# Patient Record
Sex: Male | Born: 1985 | Race: Black or African American | Hispanic: No | Marital: Single | Smoking: Never smoker
Health system: Southern US, Community
[De-identification: ages and names within clinical notes are randomized; demographics above are authoritative.]

## PROBLEM LIST (undated history)

## (undated) DIAGNOSIS — I10 Essential (primary) hypertension: Secondary | ICD-10-CM

## (undated) DIAGNOSIS — E119 Type 2 diabetes mellitus without complications: Secondary | ICD-10-CM

---

## 2009-06-05 ENCOUNTER — Emergency Department (HOSPITAL_COMMUNITY): Admission: EM | Admit: 2009-06-05 | Discharge: 2009-06-05 | Payer: Self-pay | Admitting: Emergency Medicine

## 2010-05-10 ENCOUNTER — Ambulatory Visit: Payer: Self-pay | Admitting: Internal Medicine

## 2010-05-10 ENCOUNTER — Telehealth: Payer: Self-pay | Admitting: *Deleted

## 2010-05-10 DIAGNOSIS — Z0289 Encounter for other administrative examinations: Secondary | ICD-10-CM

## 2010-05-10 NOTE — Telephone Encounter (Signed)
Office Message from Date: 05/09/2010 12:00:00 AM Time of Call: [phone] Faxed To: [facility] CallerStephane Niemann Fax Number: [phone] Facility: N/A Patient: Matthew Wiggins, Matthew Wiggins DOB: February 26, 1985 Phone: [phone] Provider: Thomos Lemons Message: See info below Regarding Appointment: Yes Appt Date: 05/10/2010 Appt Time: 9:30:00 AM Provider: Thomos Lemons Reason: Details: cannot keep appointment Outcome: Instructed patient to call back on the next business day. Message Taken by: Caryl Never, CSR FAX Call-A

## 2010-08-07 ENCOUNTER — Ambulatory Visit: Payer: Self-pay | Admitting: Family

## 2010-08-08 ENCOUNTER — Ambulatory Visit: Payer: Self-pay | Admitting: Family

## 2010-08-14 ENCOUNTER — Ambulatory Visit: Payer: Self-pay | Admitting: Family

## 2010-08-14 DIAGNOSIS — Z0289 Encounter for other administrative examinations: Secondary | ICD-10-CM

## 2012-08-16 ENCOUNTER — Emergency Department (INDEPENDENT_AMBULATORY_CARE_PROVIDER_SITE_OTHER): Payer: Managed Care, Other (non HMO)

## 2012-08-16 ENCOUNTER — Emergency Department (INDEPENDENT_AMBULATORY_CARE_PROVIDER_SITE_OTHER)
Admission: EM | Admit: 2012-08-16 | Discharge: 2012-08-16 | Disposition: A | Discharge status: Home / Self Care | Payer: Managed Care, Other (non HMO) | Source: Home / Self Care | Attending: Emergency Medicine | Admitting: Emergency Medicine

## 2012-08-16 ENCOUNTER — Encounter (HOSPITAL_COMMUNITY): Payer: Self-pay | Admitting: Emergency Medicine

## 2012-08-16 DIAGNOSIS — S42001A Fracture of unspecified part of right clavicle, initial encounter for closed fracture: Secondary | ICD-10-CM

## 2012-08-16 DIAGNOSIS — S42009A Fracture of unspecified part of unspecified clavicle, initial encounter for closed fracture: Secondary | ICD-10-CM

## 2012-08-16 HISTORY — DX: Type 2 diabetes mellitus without complications: E11.9

## 2012-08-16 HISTORY — DX: Essential (primary) hypertension: I10

## 2012-08-16 MED ORDER — HYDROCODONE-ACETAMINOPHEN 5-325 MG PO TABS
2.0000 | ORAL_TABLET | Freq: Once | ORAL | Status: AC
  Administered 2012-08-16: 2 via ORAL

## 2012-08-16 MED ORDER — HYDROCODONE-ACETAMINOPHEN 5-325 MG PO TABS
ORAL_TABLET | ORAL | Status: AC
  Filled 2012-08-16: qty 2

## 2012-08-16 MED ORDER — HYDROCODONE-ACETAMINOPHEN 5-325 MG PO TABS: 2.0000 | ORAL_TABLET | ORAL | Status: DC | PRN
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
## 2017-07-12 ENCOUNTER — Emergency Department (HOSPITAL_COMMUNITY)
Admission: EM | Admit: 2017-07-12 | Discharge: 2017-07-12 | Disposition: A | Discharge status: Home / Self Care | Payer: No Typology Code available for payment source | Attending: Emergency Medicine | Admitting: Emergency Medicine

## 2017-07-12 ENCOUNTER — Encounter (HOSPITAL_COMMUNITY): Payer: Self-pay | Admitting: *Deleted

## 2017-07-12 ENCOUNTER — Emergency Department (HOSPITAL_COMMUNITY): Payer: No Typology Code available for payment source

## 2017-07-12 DIAGNOSIS — M7918 Myalgia, other site: Secondary | ICD-10-CM

## 2017-07-12 DIAGNOSIS — M25561 Pain in right knee: Secondary | ICD-10-CM | POA: Diagnosis not present

## 2017-07-12 DIAGNOSIS — Y939 Activity, unspecified: Secondary | ICD-10-CM | POA: Diagnosis not present

## 2017-07-12 DIAGNOSIS — E119 Type 2 diabetes mellitus without complications: Secondary | ICD-10-CM | POA: Diagnosis not present

## 2017-07-12 DIAGNOSIS — S60222A Contusion of left hand, initial encounter: Secondary | ICD-10-CM | POA: Diagnosis not present

## 2017-07-12 DIAGNOSIS — I10 Essential (primary) hypertension: Secondary | ICD-10-CM | POA: Insufficient documentation

## 2017-07-12 DIAGNOSIS — Y999 Unspecified external cause status: Secondary | ICD-10-CM | POA: Insufficient documentation

## 2017-07-12 DIAGNOSIS — Y929 Unspecified place or not applicable: Secondary | ICD-10-CM | POA: Diagnosis not present

## 2017-07-12 DIAGNOSIS — S6982XA Other specified injuries of left wrist, hand and finger(s), initial encounter: Secondary | ICD-10-CM | POA: Diagnosis present

## 2017-07-12 MED ORDER — HYDROCODONE-ACETAMINOPHEN 5-325 MG PO TABS: 2.0000 | ORAL_TABLET | ORAL | 0 refills | Status: DC | PRN

## 2017-07-12 MED ORDER — IBUPROFEN 800 MG PO TABS: 800.0000 mg | ORAL_TABLET | Freq: Three times a day (TID) | ORAL | 0 refills | Status: DC

## 2017-07-12 MED ORDER — IBUPROFEN 800 MG PO TABS
800.0000 mg | ORAL_TABLET | Freq: Once | ORAL | Status: AC
  Administered 2017-07-12: 800 mg via ORAL
  Filled 2017-07-12: qty 1
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
## 2017-07-12 NOTE — Discharge Instructions (Signed)
See the Orthopaedist if pain persist.
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
## 2017-07-12 NOTE — ED Notes (Signed)
Patient ambulatory to nurses station without difficulty. Ace wrap applied to left hand, per PA request.

## 2017-07-12 NOTE — ED Notes (Signed)
Patient transported to X-ray 
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
## 2017-08-22 ENCOUNTER — Encounter: Payer: Self-pay | Admitting: Internal Medicine

## 2017-08-22 ENCOUNTER — Ambulatory Visit: Payer: Self-pay | Attending: Internal Medicine | Admitting: Internal Medicine

## 2017-08-22 VITALS — BP 171/119 | HR 104 | Temp 97.6°F | Resp 16 | Ht 72.0 in | Wt >= 6400 oz

## 2017-08-22 DIAGNOSIS — R7303 Prediabetes: Secondary | ICD-10-CM | POA: Insufficient documentation

## 2017-08-22 DIAGNOSIS — R0683 Snoring: Secondary | ICD-10-CM

## 2017-08-22 DIAGNOSIS — M1731 Unilateral post-traumatic osteoarthritis, right knee: Secondary | ICD-10-CM

## 2017-08-22 DIAGNOSIS — Z8249 Family history of ischemic heart disease and other diseases of the circulatory system: Secondary | ICD-10-CM | POA: Insufficient documentation

## 2017-08-22 DIAGNOSIS — E1159 Type 2 diabetes mellitus with other circulatory complications: Secondary | ICD-10-CM | POA: Insufficient documentation

## 2017-08-22 DIAGNOSIS — Z833 Family history of diabetes mellitus: Secondary | ICD-10-CM | POA: Insufficient documentation

## 2017-08-22 DIAGNOSIS — Z880 Allergy status to penicillin: Secondary | ICD-10-CM | POA: Insufficient documentation

## 2017-08-22 DIAGNOSIS — Z131 Encounter for screening for diabetes mellitus: Secondary | ICD-10-CM | POA: Insufficient documentation

## 2017-08-22 DIAGNOSIS — D649 Anemia, unspecified: Secondary | ICD-10-CM

## 2017-08-22 DIAGNOSIS — Z560 Unemployment, unspecified: Secondary | ICD-10-CM | POA: Insufficient documentation

## 2017-08-22 DIAGNOSIS — I10 Essential (primary) hypertension: Secondary | ICD-10-CM

## 2017-08-22 DIAGNOSIS — Z7984 Long term (current) use of oral hypoglycemic drugs: Secondary | ICD-10-CM | POA: Insufficient documentation

## 2017-08-22 DIAGNOSIS — Z23 Encounter for immunization: Secondary | ICD-10-CM

## 2017-08-22 LAB — POCT GLYCOSYLATED HEMOGLOBIN (HGB A1C): HBA1C, POC (PREDIABETIC RANGE): 6.2 % (ref 5.7–6.4)

## 2017-08-22 LAB — GLUCOSE, POCT (MANUAL RESULT ENTRY): POC Glucose: 149 mg/dl — AB (ref 70–99)

## 2017-08-22 MED ORDER — LISINOPRIL-HYDROCHLOROTHIAZIDE 10-12.5 MG PO TABS: 1.0000 | ORAL_TABLET | Freq: Every day | ORAL | 3 refills | Status: DC

## 2017-08-22 MED ORDER — TETANUS-DIPHTH-ACELL PERTUSSIS 5-2.5-18.5 LF-MCG/0.5 IM SUSP: 0.5000 mL | Freq: Once | INTRAMUSCULAR | 0 refills | Status: AC

## 2017-08-22 MED ORDER — METFORMIN HCL 500 MG PO TABS: 500.0000 mg | ORAL_TABLET | Freq: Every day | ORAL | 6 refills | Status: DC

## 2017-08-22 MED FILL — metFORMIN HCL 500 MG TABS: 500 | 30 days supply | Qty: 30 | Fill #0

## 2017-08-22 MED FILL — LISINOPRIL-HCTZ 10-12.5 MG: 10-12.5 | 30 days supply | Qty: 30 | Fill #0

## 2017-08-22 NOTE — Patient Instructions (Addendum)
Prediabetes Eating Plan Prediabetes-also called impaired glucose tolerance or impaired fasting glucose-is a condition that causes blood sugar (blood glucose) levels to be higher than normal. Following a healthy diet can help to keep prediabetes under control. It can also help to lower the risk of type 2 diabetes and heart disease, which are increased in people who have prediabetes. Along with regular exercise, a healthy diet:  Promotes weight loss.  Helps to control blood sugar levels.  Helps to improve the way that the body uses insulin.  What do I need to know about this eating plan?  Use the glycemic index (GI) to plan your meals. The index tells you how quickly a food will raise your blood sugar. Choose low-GI foods. These foods take a longer time to raise blood sugar.  Pay close attention to the amount of carbohydrates in the food that you eat. Carbohydrates increase blood sugar levels.  Keep track of how many calories you take in. Eating the right amount of calories will help you to achieve a healthy weight. Losing about 7 percent of your starting weight can help to prevent type 2 diabetes.  You may want to follow a Mediterranean diet. This diet includes a lot of vegetables, lean meats or fish, whole grains, fruits, and healthy oils and fats. What foods can I eat? Grains Whole grains, such as whole-wheat or whole-grain breads, crackers, cereals, and pasta. Unsweetened oatmeal. Bulgur. Barley. Quinoa. Brown rice. Corn or whole-wheat flour tortillas or taco shells. Vegetables Lettuce. Spinach. Peas. Beets. Cauliflower. Cabbage. Broccoli. Carrots. Tomatoes. Squash. Eggplant. Herbs. Peppers. Onions. Cucumbers. Brussels sprouts. Fruits Berries. Bananas. Apples. Oranges. Grapes. Papaya. Mango. Pomegranate. Kiwi. Grapefruit. Cherries. Meats and Other Protein Sources Seafood. Lean meats, such as chicken and [place] or lean cuts of pork and beef. Tofu. Eggs. Nuts. Beans. Dairy Low-fat or  fat-free dairy products, such as yogurt, cottage cheese, and cheese. Beverages Water. Tea. Coffee. Sugar-free or diet soda. Seltzer water. Milk. Milk alternatives, such as soy or almond milk. Condiments Mustard. Relish. Low-fat, low-sugar ketchup. Low-fat, low-sugar barbecue sauce. Low-fat or fat-free mayonnaise. Sweets and Desserts Sugar-free or low-fat pudding. Sugar-free or low-fat ice cream and other frozen treats. Fats and Oils Avocado. Walnuts. Olive oil. The items listed above may not be a complete list of recommended foods or beverages. Contact your dietitian for more options. What foods are not recommended? Grains Refined white flour and flour products, such as bread, pasta, snack foods, and cereals. Beverages Sweetened drinks, such as sweet iced tea and soda. Sweets and Desserts Baked goods, such as cake, cupcakes, pastries, cookies, and cheesecake. The items listed above may not be a complete list of foods and beverages to avoid. Contact your dietitian for more information. This information is not intended to replace advice given to you by your health care provider. Make sure you discuss any questions you have with your health care provider. Document Released: 05/04/2014 Document Revised: 05/26/2015 Document Reviewed: 01/13/2014 Elsevier Interactive Patient Education  2017 Elsevier Inc.   Td Vaccine (Tetanus and Diphtheria): What You Need to Know 1. Why get vaccinated? Tetanus  and diphtheria are very serious diseases. They are rare in the [place] today, but people who do become infected often have severe complications. Td vaccine is used to protect adolescents and adults from both of these diseases. Both tetanus and diphtheria are infections caused by bacteria. Diphtheria spreads from person to person through coughing or sneezing. Tetanus-causing bacteria enter the body through cuts, scratches, or wounds. TETANUS (lockjaw) causes  painful muscle tightening and stiffness,  usually all over the body.  It can lead to tightening of muscles in the head and neck so you can't open your mouth, swallow, or sometimes even breathe. Tetanus kills about 1 out of every 10 people who are infected even after receiving the best medical care.  DIPHTHERIA can cause a thick coating to form in the back of the throat.  It can lead to breathing problems, paralysis, heart failure, and death.  Before vaccines, as many as 200,000 cases of diphtheria and hundreds of cases of tetanus were reported in the [place] each year. Since vaccination began, reports of cases for both diseases have dropped by about 99%. 2. Td vaccine Td vaccine can protect adolescents and adults from tetanus and diphtheria. Td is usually given as a booster dose every 10 years but it can also be given earlier after a severe and dirty wound or burn. Another vaccine, called Tdap, which protects against pertussis in addition to tetanus and diphtheria, is sometimes recommended instead of Td vaccine. Your doctor or the person giving you the vaccine can give you more information. Td may safely be given at the same time as other vaccines. 3. Some people should not get this vaccine  A person who has ever had a life-threatening allergic reaction after a previous dose of any tetanus or diphtheria containing vaccine, OR has a severe allergy to any part of this vaccine, should not get Td vaccine. Tell the person giving the vaccine about any severe allergies.  Talk to your doctor if you: ? had severe pain or swelling after any vaccine containing diphtheria or tetanus, ? ever had a condition called Guillain Barre Syndrome (GBS), ? aren't feeling well on the day the shot is scheduled. 4. What are the risks from Td vaccine? With any medicine, including vaccines, there is a chance of side effects. These are usually mild and go away on their own. Serious reactions are also possible but are rare. Most people who get Td vaccine do  not have any problems with it. Mild problems following Td vaccine: (Did not interfere with activities)  Pain where the shot was given (about 8 people in 10)  Redness or swelling where the shot was given (about 1 person in 4)  Mild fever (rare)  Headache (about 1 person in 4)  Tiredness (about 1 person in 4)  Moderate problems following Td vaccine: (Interfered with activities, but did not require medical attention)  Fever over 102F (rare)  Severe problems following Td vaccine: (Unable to perform usual activities; required medical attention)  Swelling, severe pain, bleeding and/or redness in the arm where the shot was given (rare).  Problems that could happen after any vaccine:  People sometimes faint after a medical procedure, including vaccination. Sitting or lying down for about 15 minutes can help prevent fainting, and injuries caused by a fall. Tell your doctor if you feel dizzy, or have vision changes or ringing in the ears.  Some people get severe pain in the shoulder and have difficulty moving the arm where a shot was given. This happens very rarely.  Any medication can cause a severe allergic reaction. Such reactions from a vaccine are very rare, estimated at fewer than 1 in a million doses, and would happen within a few minutes to a few hours after the vaccination. As with any medicine, there is a very remote chance of a vaccine causing a serious injury or death. The safety of vaccines is always  being monitored. For more information, visit: http://floyd.org/ 5. What if there is a serious reaction? What should I look for? Look for anything that concerns you, such as signs of a severe allergic reaction, very high fever, or unusual behavior. Signs of a severe allergic reaction can include hives, swelling of the face and throat, difficulty breathing, a fast heartbeat, dizziness, and weakness. These would usually start a few minutes to a few hours after the  vaccination. What should I do?  If you think it is a severe allergic reaction or other emergency that can't wait, call 9-1-1 or get the person to the nearest hospital. Otherwise, call your doctor.  Afterward, the reaction should be reported to the Vaccine Adverse Event Reporting System (VAERS). Your doctor might file this report, or you can do it yourself through the VAERS web site at www.vaers.LAgents.no, or by calling 1-548-180-1888. ? VAERS does not give medical advice. 6. The National Vaccine Injury Compensation Program The [employer] Vaccine Injury Compensation Program (VICP) is a federal program that was created to compensate people who may have been injured by certain vaccines. Persons who believe they may have been injured by a vaccine can learn about the program and about filing a claim by calling 1-(361) 180-2339 or visiting the VICP website at SpiritualWord.at. There is a time limit to file a claim for compensation. 7. How can I learn more?  Ask your doctor. He or she can give you the vaccine package insert or suggest other sources of information.  Call your local or state health department.  Contact the Centers for Disease Control and Prevention (CDC): ? Call 847 765 8111 (1-800-CDC-INFO) ? Visit CDC's website at PicCapture.uy CDC Td Vaccine VIS (04/12/15) This information is not intended to replace advice given to you by your health care provider. Make sure you discuss any questions you have with your health care provider. Document Released: 10/15/2005 Document Revised: 09/08/2015 Document Reviewed: 09/08/2015 Elsevier Interactive Patient Education  2017 Elsevier Inc.  Influenza Virus Vaccine injection (Fluarix) What is this medicine? INFLUENZA VIRUS VACCINE (in floo EN zuh VAHY ruhs vak SEEN) helps to reduce the risk of getting influenza also known as the flu. This medicine may be used for other purposes; ask your health care provider or pharmacist if you  have questions. COMMON BRAND NAME(S): Fluarix, Fluzone What should I tell my health care provider before I take this medicine? They need to know if you have any of these conditions: -bleeding disorder like hemophilia -fever or infection -Guillain-Barre syndrome or other neurological problems -immune system problems -infection with the human immunodeficiency virus (HIV) or AIDS -low blood platelet counts -multiple sclerosis -an unusual or allergic reaction to influenza virus vaccine, eggs, chicken proteins, latex, gentamicin, other medicines, foods, dyes or preservatives -pregnant or trying to get pregnant -breast-feeding How should I use this medicine? This vaccine is for injection into a muscle. It is given by a health care professional. A copy of Vaccine Information Statements will be given before each vaccination. Read this sheet carefully each time. The sheet may change frequently. Talk to your pediatrician regarding the use of this medicine in children. Special care may be needed. Overdosage: If you think you have taken too much of this medicine contact a poison control center or emergency room at once. NOTE: This medicine is only for you. Do not share this medicine with others. What if I miss a dose? This does not apply. What may interact with this medicine? -chemotherapy or radiation therapy -medicines that lower your immune  system like etanercept, anakinra, infliximab, and adalimumab -medicines that treat or prevent blood clots like warfarin -phenytoin -steroid medicines like prednisone or cortisone -theophylline -vaccines This list may not describe all possible interactions. Give your health care provider a list of all the medicines, herbs, non-prescription drugs, or dietary supplements you use. Also tell them if you smoke, drink alcohol, or use illegal drugs. Some items may interact with your medicine. What should I watch for while using this medicine? Report any side  effects that do not go away within 3 days to your doctor or health care professional. Call your health care provider if any unusual symptoms occur within 6 weeks of receiving this vaccine. You may still catch the flu, but the illness is not usually as bad. You cannot get the flu from the vaccine. The vaccine will not protect against colds or other illnesses that may cause fever. The vaccine is needed every year. What side effects may I notice from receiving this medicine? Side effects that you should report to your doctor or health care professional as soon as possible: -allergic reactions like skin rash, itching or hives, swelling of the face, lips, or tongue Side effects that usually do not require medical attention (report to your doctor or health care professional if they continue or are bothersome): -fever -headache -muscle aches and pains -pain, tenderness, redness, or swelling at site where injected -weak or tired This list may not describe all possible side effects. Call your doctor for medical advice about side effects. You may report side effects to FDA at 1-800-FDA-1088. Where should I keep my medicine? This vaccine is only given in a clinic, pharmacy, doctor's office, or other health care setting and will not be stored at home. NOTE: This sheet is a summary. It may not cover all possible information. If you have questions about this medicine, talk to your doctor, pharmacist, or health care provider.  2018 Elsevier/Gold Standard (2007-07-16 09:30:40)

## 2017-08-22 NOTE — Progress Notes (Signed)
Pt is needing a referral for a sleep study
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
## 2017-08-23 LAB — CBC
HEMATOCRIT: 38.3 % (ref 37.5–51.0)
HEMOGLOBIN: 11.9 g/dL — AB (ref 13.0–17.7)
MCH: 22.4 pg — ABNORMAL LOW (ref 26.6–33.0)
MCHC: 31.1 g/dL — ABNORMAL LOW (ref 31.5–35.7)
MCV: 72 fL — AB (ref 79–97)
Platelets: 376 10*3/uL (ref 150–450)
RBC: 5.31 x10E6/uL (ref 4.14–5.80)
RDW: 16.5 % — AB (ref 12.3–15.4)
WBC: 7.6 10*3/uL (ref 3.4–10.8)

## 2017-08-23 LAB — LIPID PANEL
CHOL/HDL RATIO: 4.6 ratio (ref 0.0–5.0)
Cholesterol, Total: 155 mg/dL (ref 100–199)
HDL: 34 mg/dL — ABNORMAL LOW (ref >39)
LDL CALC: 106 mg/dL — AB (ref 0–99)
Triglycerides: 75 mg/dL (ref 0–149)
VLDL Cholesterol Cal: 15 mg/dL (ref 5–40)

## 2017-08-23 LAB — COMPREHENSIVE METABOLIC PANEL
A/G RATIO: 1 — AB (ref 1.2–2.2)
ALT: 17 IU/L (ref 0–44)
AST: 15 IU/L (ref 0–40)
Albumin: 4 g/dL (ref 3.5–5.5)
Alkaline Phosphatase: 92 IU/L (ref 39–117)
BUN/Creatinine Ratio: 12 (ref 9–20)
BUN: 11 mg/dL (ref 6–20)
Bilirubin Total: 0.3 mg/dL (ref 0.0–1.2)
CALCIUM: 9.2 mg/dL (ref 8.7–10.2)
CO2: 20 mmol/L (ref 20–29)
CREATININE: 0.95 mg/dL (ref 0.76–1.27)
Chloride: 101 mmol/L (ref 96–106)
GFR, EST AFRICAN AMERICAN: 122 mL/min/{1.73_m2} (ref >59)
GFR, EST NON AFRICAN AMERICAN: 105 mL/min/{1.73_m2} (ref >59)
GLOBULIN, TOTAL: 3.9 g/dL (ref 1.5–4.5)
Glucose: 143 mg/dL — ABNORMAL HIGH (ref 65–99)
POTASSIUM: 4.7 mmol/L (ref 3.5–5.2)
SODIUM: 139 mmol/L (ref 134–144)
Total Protein: 7.9 g/dL (ref 6.0–8.5)

## 2017-08-23 NOTE — Progress Notes (Signed)
Pt with microcytic anemia.  Will add iron studies.

## 2017-08-23 NOTE — Addendum Note (Signed)
Addended by: Jonah BlueJOHNSON, Izabel Chim B on: 08/23/2017 03:45 PM   Modules accepted: Orders

## 2017-08-26 ENCOUNTER — Telehealth: Payer: Self-pay | Admitting: Internal Medicine

## 2017-08-26 ENCOUNTER — Telehealth: Payer: Self-pay

## 2017-08-26 LAB — IRON,TIBC AND FERRITIN PANEL
Ferritin: 300 ng/mL (ref 30–400)
Iron Saturation: 9 % — CL (ref 15–55)
Iron: 24 ug/dL — ABNORMAL LOW (ref 38–169)
Total Iron Binding Capacity: 261 ug/dL (ref 250–450)
UIBC: 237 ug/dL (ref 111–343)

## 2017-08-26 LAB — SPECIMEN STATUS REPORT

## 2017-08-26 NOTE — Telephone Encounter (Signed)
Patient called requesting lab results, verified DOB patient had no questions.

## 2017-08-26 NOTE — Telephone Encounter (Signed)
Contacted pt to go over lab results pt didn't answer left a detailed vm informing pt of results and if he has any questions or concerns to give me a call  

## 2017-08-26 NOTE — Telephone Encounter (Signed)
-----   Message from Marcine Matareborah B Johnson, MD sent at 08/23/2017  3:47 PM EDT ----- Let patient know that he has a mild anemia.  We will add iron studies to his labs and I will get back to him with results.  Kidney and liver function tests are normal.  He has mild elevation of LDL cholesterol at 106 with goal being less than 100.  Healthy eating habits and regular exercise will help to lower cholesterol.

## 2017-08-28 ENCOUNTER — Telehealth: Payer: Self-pay

## 2017-08-28 NOTE — Telephone Encounter (Signed)
Contacted pt to go over lab results pt is aware and doesn't have any questions or concerns 

## 2017-09-20 MED FILL — metFORMIN HCL 500 MG TABS: 500 | 30 days supply | Qty: 30 | Fill #1

## 2017-09-20 MED FILL — LISINOPRIL-HCTZ 10-12.5 MG: 10-12.5 | 30 days supply | Qty: 30 | Fill #1

## 2017-09-30 ENCOUNTER — Ambulatory Visit (HOSPITAL_BASED_OUTPATIENT_CLINIC_OR_DEPARTMENT_OTHER): Payer: Self-pay | Attending: Internal Medicine | Admitting: Internal Medicine

## 2017-09-30 VITALS — Ht 72.0 in | Wt >= 6400 oz

## 2017-09-30 DIAGNOSIS — R0683 Snoring: Secondary | ICD-10-CM

## 2017-09-30 DIAGNOSIS — R5383 Other fatigue: Secondary | ICD-10-CM | POA: Insufficient documentation

## 2017-09-30 DIAGNOSIS — I10 Essential (primary) hypertension: Secondary | ICD-10-CM | POA: Insufficient documentation

## 2017-09-30 DIAGNOSIS — G4733 Obstructive sleep apnea (adult) (pediatric): Secondary | ICD-10-CM | POA: Insufficient documentation

## 2017-09-30 DIAGNOSIS — Z6841 Body Mass Index (BMI) 40.0 and over, adult: Secondary | ICD-10-CM | POA: Insufficient documentation

## 2017-10-04 ENCOUNTER — Other Ambulatory Visit: Payer: Self-pay

## 2017-10-04 ENCOUNTER — Ambulatory Visit: Payer: Self-pay | Attending: Internal Medicine | Admitting: Internal Medicine

## 2017-10-04 ENCOUNTER — Ambulatory Visit (HOSPITAL_COMMUNITY)
Admission: RE | Admit: 2017-10-04 | Discharge: 2017-10-04 | Disposition: A | Discharge status: Home / Self Care | Payer: Self-pay | Source: Ambulatory Visit | Attending: Family Medicine | Admitting: Family Medicine

## 2017-10-04 ENCOUNTER — Encounter: Payer: Self-pay | Admitting: Internal Medicine

## 2017-10-04 DIAGNOSIS — R0683 Snoring: Secondary | ICD-10-CM | POA: Insufficient documentation

## 2017-10-04 DIAGNOSIS — Z791 Long term (current) use of non-steroidal anti-inflammatories (NSAID): Secondary | ICD-10-CM | POA: Insufficient documentation

## 2017-10-04 DIAGNOSIS — R7303 Prediabetes: Secondary | ICD-10-CM | POA: Insufficient documentation

## 2017-10-04 DIAGNOSIS — M1731 Unilateral post-traumatic osteoarthritis, right knee: Secondary | ICD-10-CM | POA: Insufficient documentation

## 2017-10-04 DIAGNOSIS — Z7984 Long term (current) use of oral hypoglycemic drugs: Secondary | ICD-10-CM | POA: Insufficient documentation

## 2017-10-04 DIAGNOSIS — E785 Hyperlipidemia, unspecified: Secondary | ICD-10-CM | POA: Insufficient documentation

## 2017-10-04 DIAGNOSIS — D509 Iron deficiency anemia, unspecified: Secondary | ICD-10-CM | POA: Insufficient documentation

## 2017-10-04 DIAGNOSIS — Z6841 Body Mass Index (BMI) 40.0 and over, adult: Secondary | ICD-10-CM | POA: Insufficient documentation

## 2017-10-04 DIAGNOSIS — Z833 Family history of diabetes mellitus: Secondary | ICD-10-CM | POA: Insufficient documentation

## 2017-10-04 DIAGNOSIS — R000 Tachycardia, unspecified: Secondary | ICD-10-CM | POA: Insufficient documentation

## 2017-10-04 DIAGNOSIS — Z880 Allergy status to penicillin: Secondary | ICD-10-CM | POA: Insufficient documentation

## 2017-10-04 DIAGNOSIS — Z8249 Family history of ischemic heart disease and other diseases of the circulatory system: Secondary | ICD-10-CM | POA: Insufficient documentation

## 2017-10-04 DIAGNOSIS — Z79899 Other long term (current) drug therapy: Secondary | ICD-10-CM | POA: Insufficient documentation

## 2017-10-04 DIAGNOSIS — I10 Essential (primary) hypertension: Secondary | ICD-10-CM | POA: Insufficient documentation

## 2017-10-04 LAB — GLUCOSE, POCT (MANUAL RESULT ENTRY): POC GLUCOSE: 142 mg/dL — AB (ref 70–99)

## 2017-10-04 NOTE — Progress Notes (Signed)
Patient ID: Matthew Wiggins, male    DOB: July 13, 1985  MRN: [id number]  CC: No chief complaint on file.   Subjective: Matthew Wiggins is a 32 y.o. male who presents for f/u visit.  Last seen 08/2017 as new pt. Mother, Jon Gills, is with him.  His concerns today include:  OA knee, HTN, morbid obesity, pre-DM, mild anemia  Obesity:  He has loss 22 lbs since I last saw him in August.  There is a weight recorded of 544 done 09/30/2017.  Patient states that was recorded at the time of his sleep study which was done on that day.  They did not weigh him but instead recorded that from his last weight that was done on his office visit in August. -he has cut out red meat.  Eating more fruits instead of sweet snacks -walking 5 mins QOD.   HTN: Started on lisinopril/HCTZ on last visit.  He is tolerating the medicine well.  Using Ms. Dash and garlic powder for seasoning.  No chest pains.  Noted to be tachycardic today.  He denies any palpitations.  Review of flowsheet shows mild tachycardia on previous encounters with the health system  HL: LDL was elevated at 106.  We held off on starting him on medication to give him time to enact dietary changes which he has been doing.  Snoring: Had sleep study done earlier this week.  Results are not back as yet.  Anemia: Mild microcytic anemia on CBC.  Iron studies showed low iron and iron saturation but normal ferritin and low normal TIBC.  Patient denies any changes in bowel habits, no black stools or blood in the stools.  No history of hemorrhoids.  No family history of sickle cell.  Patient Active Problem List   Diagnosis Date Noted  . Morbid obesity (HCC) 08/22/2017  . Loud snoring 08/22/2017  . Essential hypertension 08/22/2017  . Post-traumatic osteoarthritis of right knee 08/22/2017  . Prediabetes 08/22/2017     Current Outpatient Medications on File Prior to Visit  Medication Sig Dispense Refill  . HYDROcodone-acetaminophen (NORCO/VICODIN) 5-325 MG  tablet Take 2 tablets by mouth every 4 (four) hours as needed. (Patient not taking: Reported on 08/22/2017) 10 tablet 0  . ibuprofen (ADVIL,MOTRIN) 800 MG tablet Take 1 tablet (800 mg total) by mouth 3 (three) times daily. (Patient not taking: Reported on 08/22/2017) 21 tablet 0  . lisinopril-hydrochlorothiazide (PRINZIDE,ZESTORETIC) 10-12.5 MG tablet Take 1 tablet by mouth daily. 90 tablet 3  . metFORMIN (GLUCOPHAGE) 500 MG tablet Take 1 tablet (500 mg total) by mouth daily with breakfast. 30 tablet 6   No current facility-administered medications on file prior to visit.     Allergies  Allergen Reactions  . Amoxicillin     Social History   Socioeconomic History  . Marital status: Single    Spouse name: Not on file  . Number of children: Not on file  . Years of education: Not on file  . Highest education level: Not on file  Occupational History  . Occupation: unemployed  Social Needs  . Financial resource strain: Not on file  . Food insecurity:    Worry: Not on file    Inability: Not on file  . Transportation needs:    Medical: Not on file    Non-medical: Not on file  Tobacco Use  . Smoking status: Never Smoker  . Smokeless tobacco: Never Used  Substance and Sexual Activity  . Alcohol use: No  . Drug use: No  .  Sexual activity: Not on file  Lifestyle  . Physical activity:    Days per week: Not on file    Minutes per session: Not on file  . Stress: Not on file  Relationships  . Social connections:    Talks on phone: Not on file    Gets together: Not on file    Attends religious service: Not on file    Active member of club or organization: Not on file    Attends meetings of clubs or organizations: Not on file    Relationship status: Not on file  . Intimate partner violence:    Fear of current or ex partner: Not on file    Emotionally abused: Not on file    Physically abused: Not on file    Forced sexual activity: Not on file  Other Topics Concern  . Not on file    Social History Narrative  . Not on file    Family History  Problem Relation Age of Onset  . Diabetes Maternal Grandmother   . Hypertension Maternal Grandmother     No past surgical history on file.  ROS: Review of Systems  PHYSICAL EXAM: BP 133/86 (BP Location: Right Arm, Patient Position: Sitting, Cuff Size: Large) Comment (Cuff Size): Thigh cuff  Pulse (!) 114   Temp 97.8 F (36.6 C) (Oral)   Resp (!) 22   Ht 6\' 1"  (1.854 m)   Wt (!) 522 lb 6.4 oz (237 kg)   SpO2 98%   BMI 68.92 kg/m   Wt Readings from Last 3 Encounters:  10/04/17 (!) 522 lb 6.4 oz (237 kg)  09/30/17 (!) 544 lb (246.8 kg)  08/22/17 (!) 544 lb (246.8 kg)    Physical Exam General appearance - alert, well appearing, morbidly obese African-American male and in no distress Mental status - normal mood, behavior, speech, dress, motor activity, and thought processes Chest - clear to auscultation, no wheezes, rales or rhonchi, symmetric air entry Heart -mild tachycardia but regular.  No murmurs Extremities - peripheral pulses normal, no pedal edema, no clubbing or cyanosis  Results for orders placed or performed in visit on 10/04/17  Glucose (CBG)  Result Value Ref Range   POC Glucose 142 (A) 70 - 99 mg/dl   Lab Results  Component Value Date   WBC 7.6 08/22/2017   HGB 11.9 (L) 08/22/2017   HCT 38.3 08/22/2017   MCV 72 (L) 08/22/2017   PLT 376 08/22/2017     Chemistry      Component Value Date/Time   NA 139 08/22/2017 1004   K 4.7 08/22/2017 1004   CL 101 08/22/2017 1004   CO2 20 08/22/2017 1004   BUN 11 08/22/2017 1004   CREATININE 0.95 08/22/2017 1004      Component Value Date/Time   CALCIUM 9.2 08/22/2017 1004   ALKPHOS 92 08/22/2017 1004   AST 15 08/22/2017 1004   ALT 17 08/22/2017 1004   BILITOT 0.3 08/22/2017 1004     Lab Results  Component Value Date   CHOL 155 08/22/2017   HDL 34 (L) 08/22/2017   LDLCALC 106 (H) 08/22/2017   TRIG 75 08/22/2017   CHOLHDL 4.6 08/22/2017    Lab Results  Component Value Date   IRON 24 (L) 08/22/2017   TIBC 261 08/22/2017   FERRITIN 300 08/22/2017   EKG reveals sinus tachycardia at 102 otherwise normal EKG.  ASSESSMENT AND PLAN: 1. Morbid obesity (HCC) Commended him on the 22 pound weight loss.  Encouraged him  to continue healthy eating habits.  We have set an exercise goal of 10 minutes 3 times a week until next visit.  2. Essential hypertension Blood pressure much improved.  We will hold off on increasing the dose of lisinopril HCTZ.  Patient will continue to work on weight loss and continue to limit salt in the foods. - Basic Metabolic Panel  3. Microcytic anemia Patient has slightly confusing as iron saturation seems a little too low to be considered ACD.  History does not suggest any chronic blood loss.  Will check a hemoglobin electrophoresis and a repeat CBC.  Further management will be based on results. - Hemoglobinopathy evaluation - CBC  4. Loud snoring Await results of sleep study.  5. Prediabetes Commended him on weight loss.  Encouraged him to continue healthy eating habits - Glucose (CBG)  6. Tachycardia Will monitor for now.  If still above 100 on follow-up visit we will consider adding low-dose of Coreg - EKG 12-Lead - TSH   Patient was given the opportunity to ask questions.  Patient verbalized understanding of the plan and was able to repeat key elements of the plan.   Orders Placed This Encounter  Procedures  . Glucose (CBG)     Requested Prescriptions    No prescriptions requested or ordered in this encounter    No follow-ups on file.  Jonah Blue, MD, FACP

## 2017-10-04 NOTE — Patient Instructions (Signed)
Your blood pressure has significantly decreased and you have lost 22 pounds since our last visit.  This is excellent.  Keep up the good works.  Try to increase your exercise to 10 minutes 3 times a week.  Follow a Healthy Eating Plan - You can do it! Limit sugary drinks.  Avoid sodas, sweet tea, sport or energy drinks, or fruit drinks.  Drink water, lo-fat milk, or diet drinks. Limit snack foods.   Cut back on candy, cake, cookies, chips, ice cream.  These are a special treat, only in small amounts. Eat plenty of vegetables.  Especially dark green, red, and orange vegetables. Aim for at least 3 servings a day. More is better! Include fruit in your daily diet.  Whole fruit is much healthier than fruit juice! Limit "white" bread, "white" pasta, "white" rice.   Choose "100% whole grain" products, brown or wild rice. Avoid fatty meats. Try "Meatless Monday" and choose eggs or beans one day a week.  When eating meat, choose lean meats like chicken, [place], and fish.  Grill, broil, or bake meats instead of frying, and eat poultry without the skin. Eat less salt.  Avoid frozen pizzas, frozen dinners and salty foods.  Use seasonings other than salt in cooking.  This can help blood pressure and keep you from swelling Beer, wine and liquor have calories.  If you can safely drink alcohol, limit to 1 drink per day for women, 2 drinks for men

## 2017-10-07 LAB — CBC
HEMOGLOBIN: 12.1 g/dL — AB (ref 13.0–17.7)
Hematocrit: 39.2 % (ref 37.5–51.0)
MCH: 22 pg — ABNORMAL LOW (ref 26.6–33.0)
MCHC: 30.9 g/dL — ABNORMAL LOW (ref 31.5–35.7)
MCV: 71 fL — ABNORMAL LOW (ref 79–97)
Platelets: 386 10*3/uL (ref 150–450)
RBC: 5.5 x10E6/uL (ref 4.14–5.80)
RDW: 15.8 % — ABNORMAL HIGH (ref 12.3–15.4)
WBC: 7.2 10*3/uL (ref 3.4–10.8)

## 2017-10-07 LAB — BASIC METABOLIC PANEL
BUN/Creatinine Ratio: 11 (ref 9–20)
BUN: 10 mg/dL (ref 6–20)
CALCIUM: 9.3 mg/dL (ref 8.7–10.2)
CHLORIDE: 96 mmol/L (ref 96–106)
CO2: 24 mmol/L (ref 20–29)
Creatinine, Ser: 0.87 mg/dL (ref 0.76–1.27)
GFR calc Af Amer: 131 mL/min/{1.73_m2} (ref >59)
GFR calc non Af Amer: 113 mL/min/{1.73_m2} (ref >59)
GLUCOSE: 108 mg/dL — AB (ref 65–99)
Potassium: 4.2 mmol/L (ref 3.5–5.2)
Sodium: 135 mmol/L (ref 134–144)

## 2017-10-07 LAB — HEMOGLOBINOPATHY EVALUATION
HGB C: 0 %
HGB S: 0 %
HGB VARIANT: 0 %
Hemoglobin A2 Quantitation: 2.3 % (ref 1.8–3.2)
Hemoglobin F Quantitation: 0 % (ref 0.0–2.0)
Hgb A: 97.7 % (ref 96.4–98.8)

## 2017-10-07 LAB — TSH: TSH: 1.97 u[IU]/mL (ref 0.450–4.500)

## 2017-10-09 ENCOUNTER — Telehealth: Payer: Self-pay

## 2017-10-09 NOTE — Telephone Encounter (Signed)
Contacted pt to go over lab results pt is aware and doesn't have any questions or concerns 

## 2017-10-19 DIAGNOSIS — R0683 Snoring: Secondary | ICD-10-CM

## 2017-10-19 NOTE — Procedures (Signed)
   Patient Name: Matthew Wiggins, Matthew Wiggins Date: 09/30/2017 Gender: Male D.O.B: 1985/10/13 Age (years): 32 Referring Provider: Jonah Blue MD Height (inches): 72 Interpreting Physician: Jetty Duhamel MD, ABSM Weight (lbs): 544 RPSGT: Ulyess Mort BMI: 74 MRN: [id number] Neck Size: 20.00  CLINICAL INFORMATION Sleep Study Type: NPSG Indication for sleep study: Daytime Fatigue, Fatigue, Hypertension, Morbid Obesity, Snoring  Epworth Sleepiness Score: 16  SLEEP STUDY TECHNIQUE As per the AASM Manual for the Scoring of Sleep and Associated Events v2.3 (April 2016) with a hypopnea requiring 4% desaturations.  The channels recorded and monitored were frontal, central and occipital EEG, electrooculogram (EOG), submentalis EMG (chin), nasal and oral airflow, thoracic and abdominal wall motion, anterior tibialis EMG, snore microphone, electrocardiogram, and pulse oximetry.  MEDICATIONS Medications self-administered by patient taken the night of the study : none reported  SLEEP ARCHITECTURE The study was initiated at 10:38:02 PM and ended at 4:53:17 AM.  Sleep onset time was 49.9 minutes and the sleep efficiency was 40.5%%. The total sleep time was 152 minutes.  Stage REM latency was N/A minutes.  The patient spent 58.9%% of the night in stage N1 sleep, 41.1%% in stage N2 sleep, 0.0%% in stage N3 and 0% in REM.  Alpha intrusion was absent.  Supine sleep was 6.25%.  RESPIRATORY PARAMETERS The overall apnea/hypopnea index (AHI) was 114.9 per hour. There were 245 total apneas, including 237 obstructive, 2 central and 6 mixed apneas. There were 46 hypopneas and 13 RERAs.  The AHI during Stage REM sleep was N/A per hour.  AHI while supine was 18.9 per hour.  The mean oxygen saturation was 94.5%. The minimum SpO2 during sleep was 81.0%.  moderate snoring was noted during this study.  CARDIAC DATA The 2 lead EKG demonstrated sinus rhythm. The mean heart rate was 83.8 beats per  minute. Other EKG findings include: None.  LEG MOVEMENT DATA The total PLMS were 0 with a resulting PLMS index of 0.0. Associated arousal with leg movement index was 0.0 .  IMPRESSIONS - Severe obstructive sleep apnea occurred during this study (AHI = 114.9/h). - No significant central sleep apnea occurred during this study (CAI = 0.8/h). - Mild oxygen desaturation was noted during this study (Min O2 = 81.0%). Mean 94.5% - The patient snored with moderate snoring volume. - No cardiac abnormalities were noted during this study. - Clinically significant periodic limb movements did not occur during sleep. No significant associated arousals.  DIAGNOSIS - Obstructive Sleep Apnea (327.23 [G47.33 ICD-10])  RECOMMENDATIONS - Therapeutic CPAP titration to determine optimal pressure required to alleviate sleep disordered breathing. Other options would be based on clinical judgment. - Be careful with alcohol, sedatives and other CNS depressants that may worsen sleep apnea and disrupt normal sleep architecture. - Sleep hygiene should be reviewed to assess factors that may improve sleep quality. - Weight management and regular exercise should be initiated or continued if appropriate.  [Electronically signed] 10/19/2017 12:30 PM  Jetty Duhamel MD, ABSM Diplomate, American Board of Sleep Medicine   NPI: [id number]                         Jetty Duhamel Diplomate, American Board of Sleep Medicine  ELECTRONICALLY SIGNED ON:  10/19/2017, 12:29 PM [facility] PH: [phone]   FX: [phone] ACCREDITED BY THE AMERICAN ACADEMY OF SLEEP MEDICINE

## 2017-10-20 ENCOUNTER — Other Ambulatory Visit: Payer: Self-pay | Admitting: Internal Medicine

## 2017-10-20 DIAGNOSIS — G4733 Obstructive sleep apnea (adult) (pediatric): Secondary | ICD-10-CM

## 2017-10-22 ENCOUNTER — Telehealth: Payer: Self-pay

## 2017-10-22 NOTE — Telephone Encounter (Signed)
Contacted pt to go over sleep study results pt is aware and doesn't have any questions or concerns  

## 2017-10-23 MED FILL — LISINOPRIL-HCTZ 10-12.5 MG: 10-12.5 | 30 days supply | Qty: 30 | Fill #2

## 2017-10-23 MED FILL — metFORMIN HCL 500 MG TABS: 500 | 30 days supply | Qty: 30 | Fill #2

## 2017-11-25 MED FILL — metFORMIN HCL 500 MG TABS: 500 | 30 days supply | Qty: 30 | Fill #3

## 2017-12-02 MED FILL — LISINOPRIL-HCTZ 10-12.5 MG: 10-12.5 | 30 days supply | Qty: 30 | Fill #3

## 2017-12-06 ENCOUNTER — Ambulatory Visit: Payer: Self-pay | Attending: Internal Medicine | Admitting: Internal Medicine

## 2017-12-06 ENCOUNTER — Encounter: Payer: Self-pay | Admitting: Internal Medicine

## 2017-12-06 VITALS — BP 160/104 | HR 95 | Temp 97.7°F | Resp 16 | Wt >= 6400 oz

## 2017-12-06 DIAGNOSIS — Z6841 Body Mass Index (BMI) 40.0 and over, adult: Secondary | ICD-10-CM | POA: Insufficient documentation

## 2017-12-06 DIAGNOSIS — G4733 Obstructive sleep apnea (adult) (pediatric): Secondary | ICD-10-CM | POA: Insufficient documentation

## 2017-12-06 DIAGNOSIS — Z880 Allergy status to penicillin: Secondary | ICD-10-CM | POA: Insufficient documentation

## 2017-12-06 DIAGNOSIS — Z8249 Family history of ischemic heart disease and other diseases of the circulatory system: Secondary | ICD-10-CM | POA: Insufficient documentation

## 2017-12-06 DIAGNOSIS — R7303 Prediabetes: Secondary | ICD-10-CM | POA: Insufficient documentation

## 2017-12-06 DIAGNOSIS — I10 Essential (primary) hypertension: Secondary | ICD-10-CM | POA: Insufficient documentation

## 2017-12-06 DIAGNOSIS — Z79899 Other long term (current) drug therapy: Secondary | ICD-10-CM | POA: Insufficient documentation

## 2017-12-06 DIAGNOSIS — Z833 Family history of diabetes mellitus: Secondary | ICD-10-CM | POA: Insufficient documentation

## 2017-12-06 DIAGNOSIS — Z7984 Long term (current) use of oral hypoglycemic drugs: Secondary | ICD-10-CM | POA: Insufficient documentation

## 2017-12-06 LAB — POCT GLYCOSYLATED HEMOGLOBIN (HGB A1C): HbA1c, POC (prediabetic range): 5.8 % (ref 5.7–6.4)

## 2017-12-06 LAB — GLUCOSE, POCT (MANUAL RESULT ENTRY): POC Glucose: 123 mg/dl — AB (ref 70–99)

## 2017-12-06 NOTE — Progress Notes (Signed)
Patient ID: Matthew Wiggins, male    DOB: Aug 16, 1985  MRN: [id number]  CC: Diabetes (prediabetes) and Hypertension   Subjective: Matthew Wiggins is a 32 y.o. male who presents for chronic ds management.  Mom is with him.  His concerns today include:  OA knee, HTN, morbid obesity, pre-DM, mild anemia  OSA: Patient had sleep study that revealed severe obstructive sleep apnea.  He has titration study scheduled for next week.  He is wondering whether he would qualify for disability because of OSA  Obesity:  Gained 5 lbs since last visit.  Attributes this to over eating during Thanksgiving.  However overall he continues to try to limit white carbohydrates.  Very rarely eats bread or rice. Walking daily for 10 mins.    HTN:  Did no take med as yet for his a.m. he limits salt in the foods.  Mom does most of the cooking.  Patient Active Problem List   Diagnosis Date Noted  . Microcytic anemia 10/04/2017  . Tachycardia 10/04/2017  . Morbid obesity (HCC) 08/22/2017  . Loud snoring 08/22/2017  . Essential hypertension 08/22/2017  . Post-traumatic osteoarthritis of right knee 08/22/2017  . Prediabetes 08/22/2017     Current Outpatient Medications on File Prior to Visit  Medication Sig Dispense Refill  . lisinopril-hydrochlorothiazide (PRINZIDE,ZESTORETIC) 10-12.5 MG tablet Take 1 tablet by mouth daily. 90 tablet 3  . metFORMIN (GLUCOPHAGE) 500 MG tablet Take 1 tablet (500 mg total) by mouth daily with breakfast. 30 tablet 6   No current facility-administered medications on file prior to visit.     Allergies  Allergen Reactions  . Amoxicillin     Social History   Socioeconomic History  . Marital status: Single    Spouse name: Not on file  . Number of children: Not on file  . Years of education: Not on file  . Highest education level: Not on file  Occupational History  . Occupation: unemployed  Social Needs  . Financial resource strain: Not on file  . Food insecurity:   Worry: Not on file    Inability: Not on file  . Transportation needs:    Medical: Not on file    Non-medical: Not on file  Tobacco Use  . Smoking status: Never Smoker  . Smokeless tobacco: Never Used  Substance and Sexual Activity  . Alcohol use: No  . Drug use: No  . Sexual activity: Not on file  Lifestyle  . Physical activity:    Days per week: Not on file    Minutes per session: Not on file  . Stress: Not on file  Relationships  . Social connections:    Talks on phone: Not on file    Gets together: Not on file    Attends religious service: Not on file    Active member of club or organization: Not on file    Attends meetings of clubs or organizations: Not on file    Relationship status: Not on file  . Intimate partner violence:    Fear of current or ex partner: Not on file    Emotionally abused: Not on file    Physically abused: Not on file    Forced sexual activity: Not on file  Other Topics Concern  . Not on file  Social History Narrative  . Not on file    Family History  Problem Relation Age of Onset  . Diabetes Maternal Grandmother   . Hypertension Maternal Grandmother     No past  surgical history on file.  ROS: Review of Systems Negative except as above PHYSICAL EXAM: BP (!) 160/104 (BP Location: Left Arm) Comment: recheck Comment (Cuff Size): thigh  Pulse 95   Temp 97.7 F (36.5 C) (Oral)   Resp 16   Wt (!) 527 lb (239 kg)   SpO2 97%   BMI 69.53 kg/m   Wt Readings from Last 3 Encounters:  12/06/17 (!) 527 lb (239 kg)  10/04/17 (!) 522 lb 6.4 oz (237 kg)  09/30/17 (!) 544 lb (246.8 kg)    Physical Exam General appearance - alert, well appearing, morbidly obese young African-American male and in no distress Mental status - normal mood, behavior, speech, dress, motor activity, and thought processes Neck - supple, no significant adenopathy Chest - clear to auscultation, no wheezes, rales or rhonchi, symmetric air entry Heart - normal rate,  regular rhythm, normal S1, S2, no murmurs, rubs, clicks or gallops Extremities -no lower extremity edema  Results for orders placed or performed in visit on 12/06/17  POCT glucose (manual entry)  Result Value Ref Range   POC Glucose 123 (A) 70 - 99 mg/dl  POCT glycosylated hemoglobin (Hb A1C)  Result Value Ref Range   Hemoglobin A1C     HbA1c POC (<> result, manual entry)     HbA1c, POC (prediabetic range) 5.8 5.7 - 6.4 %   HbA1c, POC (controlled diabetic range)      ASSESSMENT AND PLAN:  1. OSA (obstructive sleep apnea) Patient will call me about a week or 2 after he has had the titration study so that I can write a prescription and send it to our case manager who will help him acquire a CPAP machine. -I think we can hold off on applying for disability as the sleep apnea should respond to treatment with CPAP machine.  2. Prediabetes A1c has improved since last visit. Continue regular exercise.  He has set a goal to increase to 15 minutes of walking daily Healthy eating habits discussed and encouraged.  Encouraged him to buy 1 of those plates tablets divided into 4 quadrants.  He can use it to help manage his portion sizes. - POCT glucose (manual entry) - POCT glycosylated hemoglobin (Hb A1C)  3. Morbid obesity (HCC) See #2 above  4. Essential hypertension Not at goal but patient has not taken medication as yet for today.  Encouraged him to continue lisinopril HCTZ.   Patient was given the opportunity to ask questions.  Patient verbalized understanding of the plan and was able to repeat key elements of the plan.   Orders Placed This Encounter  Procedures  . POCT glucose (manual entry)  . POCT glycosylated hemoglobin (Hb A1C)     Requested Prescriptions    No prescriptions requested or ordered in this encounter    Return in about 3 months (around 03/07/2018).  Jonah Blueeborah Analeise Mccleery, MD, FACP

## 2017-12-06 NOTE — Patient Instructions (Signed)
Please call me about a week or 2 after you have had the second part of your sleep study done.  I will have our caseworker work with you in getting a CPAP machine.   Try getting 1 of those plates that is divided into 4 quadrants and use it when you eat dinner as a way of helping you limit your portion sizes.  Try to increase your exercise to 15 minutes daily.

## 2017-12-12 ENCOUNTER — Ambulatory Visit (HOSPITAL_BASED_OUTPATIENT_CLINIC_OR_DEPARTMENT_OTHER): Payer: Self-pay

## 2017-12-17 ENCOUNTER — Ambulatory Visit (HOSPITAL_BASED_OUTPATIENT_CLINIC_OR_DEPARTMENT_OTHER): Payer: Self-pay | Attending: Internal Medicine | Admitting: Internal Medicine

## 2017-12-17 VITALS — Ht 72.0 in | Wt >= 6400 oz

## 2017-12-17 DIAGNOSIS — G4733 Obstructive sleep apnea (adult) (pediatric): Secondary | ICD-10-CM | POA: Insufficient documentation

## 2017-12-21 DIAGNOSIS — G4733 Obstructive sleep apnea (adult) (pediatric): Secondary | ICD-10-CM

## 2017-12-21 NOTE — Procedures (Signed)
   Patient Name: Matthew Wiggins, Matthew Wiggins Study Date: 12/17/2017 Gender: Male D.O.B: 1985-07-23 Age (years): 32 Referring Provider: Jonah Blueeborah Johnson MD Height (inches): 72 Interpreting Physician: Jetty Duhamellinton Marijean Montanye MD, ABSM Weight (lbs): 517 RPSGT: Shelah LewandowskyGregory, Ken[place] BMI: 70 MRN: [id number] Neck Size: 21.50  CLINICAL INFORMATION The patient is referred for a BiPAP titration to treat sleep apnea.  Date of NPSG, Split Night or HST:  NPSG 09/30/17  AHI 114.9/ hr, desaturation to 81%, body weight 544 lbs  SLEEP STUDY TECHNIQUE As per the AASM Manual for the Scoring of Sleep and Associated Events v2.3 (April 2016) with a hypopnea requiring 4% desaturations.  The channels recorded and monitored were frontal, central and occipital EEG, electrooculogram (EOG), submentalis EMG (chin), nasal and oral airflow, thoracic and abdominal wall motion, anterior tibialis EMG, snore microphone, electrocardiogram, and pulse oximetry. Bilevel positive airway pressure (BPAP) was initiated at the beginning of the study and titrated to treat sleep-disordered breathing.  MEDICATIONS Medications self-administered by patient taken the night of the study : none reported  RESPIRATORY PARAMETERS Optimal IPAP Pressure (cm): 24 AHI at Optimal Pressure (/hr) 0.0 Optimal EPAP Pressure (cm): 20   Overall Minimal O2 (%): 90.0 Minimal O2 at Optimal Pressure (%): 98.0 SLEEP ARCHITECTURE Start Time: 10:30:37 PM Stop Time: 4:47:50 AM Total Time (min): 377.2 Total Sleep Time (min): 141 Sleep Latency (min): 14.9 Sleep Efficiency (%): 37.4% REM Latency (min): N/A WASO (min): 221.3 Stage N1 (%): 53.9% Stage N2 (%): 46.1% Stage N3 (%): 0.0% Stage R (%): 0 Supine (%): 39.01 Arousal Index (/hr): 79.6   CARDIAC DATA The 2 lead EKG demonstrated sinus rhythm. The mean heart rate was 79.6 beats per minute. Other EKG findings include: None.  LEG MOVEMENT DATA The total Periodic Limb Movements of Sleep (PLMS) were 0. The PLMS index was 0.0. A  PLMS index of <15 is considered normal in adults.  IMPRESSIONS - CPAP was titrated to 20 cwp without adequate control and was changed to BIPAP. - lAn optimal BIPAP pressure was selected for this patient ( 24 /20 cm of water) - Central sleep apnea was not noted during this titration (CAI = 2.1/h). - Significant oxygen desaturations were not observed during this titration (min O2 = 90.0%). - The patient snored with moderate snoring volume. - No cardiac abnormalities were observed during this study. - Clinically significant periodic limb movements were not noted during this study. Arousals associated with PLMs were rare.  DIAGNOSIS - Obstructive Sleep Apnea (327.23 [G47.33 ICD-10])  RECOMMENDATIONS - Trial of BiPAP therapy on 24/20 cm H2O, PS 3, or autoBIPAP. -  Patient used a Medium size Fisher[employer]Paykel Full Face Mask Simplus mask and heated humidification. - Be careful with alcohol, sedatives and other CNS depressants that may worsen sleep apnea and disrupt normal sleep architecture. - Sleep hygiene should be reviewed to assess factors that may improve sleep quality. - Weight management and regular exercise should be initiated or continued.  [Electronically signed] 12/21/2017 02:49 PM  Jetty Duhamellinton Tahisha Hakim MD, ABSM Diplomate, American Board of Sleep Medicine   NPI: [id number]                          Jetty Duhamellinton Taneal Sonntag Diplomate, American Board of Sleep Medicine  ELECTRONICALLY SIGNED ON:  12/21/2017, 2:45 PM [facility] PH: [phone]   FX: [phone] ACCREDITED BY THE AMERICAN ACADEMY OF SLEEP MEDICINE

## 2017-12-23 ENCOUNTER — Telehealth: Payer: Self-pay | Admitting: Internal Medicine

## 2017-12-23 DIAGNOSIS — G4733 Obstructive sleep apnea (adult) (pediatric): Secondary | ICD-10-CM

## 2017-12-23 NOTE — Telephone Encounter (Signed)
Call placed to the patient regarding the need for BiPAP machine. He stated that he has no insurance and is not able to afford the cost of a machine.  This CM explained to him that the American [employer] has a CPAP assistance program.  They provide refurbished machines for patients in return for a $100 donation. The patient stated that he does not have $100 for the donation.  This CM also explained that [facility].  [facility] will pay for 1 machine/patient. There is no guarantee that comes with the machine. It may take a couple of weeks to  3+ months to receive the machine.  [facility] and then a teaching session will be arranged for the patient to meet with a respiratory therapist to review the use and care of the machine before he can take it home.  He stated that he understood. This CM also explained that he would need to sign a waiver for the Sleep Apnea Association before the request could be processed.  This document would be [facility] at [facility] front [facility] and he can come in to sign when he is able.  Again he said that he understood and would come to the clinic to sign.

## 2017-12-31 ENCOUNTER — Telehealth: Payer: Self-pay

## 2017-12-31 NOTE — Telephone Encounter (Signed)
Call placed to the patient and reminded him that [facility] needs to [facility] to [facility] to sign the waiver for the CPAP before the machine can be ordered.  He stated that he understood

## 2018-01-17 ENCOUNTER — Telehealth: Payer: Self-pay

## 2018-01-17 NOTE — Telephone Encounter (Signed)
The patient has not come in to sign the waiver for the American [employer] Association  - CPAP assistance program.  In the meantime, I have been notified by the program that they are not carrying BiPAP machines any longer.  Please advise

## 2018-01-22 ENCOUNTER — Telehealth: Payer: Self-pay

## 2018-01-22 ENCOUNTER — Telehealth: Payer: Self-pay | Admitting: Internal Medicine

## 2018-01-22 NOTE — Telephone Encounter (Signed)
Patient came to clinic and signed waiver for American [employer] - CPAP assistance program

## 2018-01-22 NOTE — Telephone Encounter (Signed)
-----   Message from Waymon Budge, MD sent at 01/21/2018  9:54 AM EST ----- Hey- I understand. We will have to accept less than optimal control, but better than nothing. If available, I would use CPAP with autopap, 10-20 cwp. If only fixed pressure is available, then try 20 cwp. -Clint Young ----- Message ----- From: Marcine Matar, MD Sent: 01/18/2018   8:09 AM EST To: Waymon Budge, MD  I am the PCP for this pt at [facility].  He had a sleep study done in 12/2017 and BiPAP was recommended.  We usually are able to assist uninsured pts with getting CPAP or BiPAP machine from a charity organization.  However, we have been informed this week that they will no longer provide BiPAP machines.  So I wanted to touch base with you to inquire whether based on his titration study you can recommend a CPAP level or whether he would need to do another titration study.  Please advise.  Thank you for assisting [place] in trying to provide care.

## 2018-01-24 ENCOUNTER — Telehealth: Payer: Self-pay

## 2018-01-24 NOTE — Telephone Encounter (Signed)
Order for APAP faxed to [employer].

## 2018-01-27 ENCOUNTER — Telehealth: Payer: Self-pay

## 2018-01-27 NOTE — Telephone Encounter (Signed)
Call placed to [employer], CPAP assistance program.  Spoke to [place] and paid the $100 donation for the CPAP machine,

## 2018-02-03 ENCOUNTER — Telehealth: Payer: Self-pay

## 2018-02-03 NOTE — Telephone Encounter (Signed)
Call placed to the patient and informed him that his CPAP machine has been delivered and the teaching session with the respiratory therapist has been scheduled for 1/20/202 @ 1000 at [facility]. He stated that he understood.

## 2018-02-10 ENCOUNTER — Telehealth: Payer: Self-pay

## 2018-02-10 NOTE — Telephone Encounter (Signed)
Patient met with Pamala Hurry, [facility] and completed teaching regarding the use/care of his APAP machine. He was given the machine at the end of his teaching session.  He had a question regarding the mask strap, stating that it is quite tight,  and was instructed to contact [facility]. No further questions/concerns reported.

## 2018-03-11 ENCOUNTER — Ambulatory Visit: Payer: Self-pay | Admitting: Internal Medicine

## 2018-05-15 MED FILL — LISINOPRIL-HCTZ 10-12.5 MG: 10-12.5 | 30 days supply | Qty: 30 | Fill #4

## 2018-06-26 MED FILL — LISINOPRIL-HCTZ 10-12.5 MG: 10-12.5 | 30 days supply | Qty: 30 | Fill #5

## 2018-07-30 MED FILL — LISINOPRIL-HCTZ 10-12.5 MG: 10-12.5 | 30 days supply | Qty: 30 | Fill #6

## 2018-09-05 ENCOUNTER — Other Ambulatory Visit: Payer: Self-pay | Admitting: Internal Medicine

## 2018-09-05 DIAGNOSIS — I10 Essential (primary) hypertension: Secondary | ICD-10-CM

## 2018-09-16 ENCOUNTER — Other Ambulatory Visit: Payer: Self-pay | Admitting: Internal Medicine

## 2018-09-16 DIAGNOSIS — I10 Essential (primary) hypertension: Secondary | ICD-10-CM

## 2018-09-16 MED FILL — LISINOPRIL-HCTZ 10-12.5 MG: 10-12.5 | 30 days supply | Qty: 30 | Fill #0

## 2018-09-16 NOTE — Telephone Encounter (Signed)
New Message   1) Medication(s) Requested (by name): lisinopril-hydrochlorothiazide (PRINZIDE,ZESTORETIC) 10-12.5 MG tablet  2) Pharmacy of Choice: [facility]  3) Special Requests: Requesting a refill until his appt on 9/23   Approved medications will be sent to the pharmacy, we will reach out if there is an issue.  Requests made after 3pm may not be addressed until the following business day!  If a patient is unsure of the name of the medication(s) please note and ask patient to call back when they are able to provide all info, do not send to responsible party until all information is available!

## 2018-09-23 NOTE — Progress Notes (Signed)
Virtual Visit via Telephone Note  I connected with Matthew Wiggins on 09/23/18 at 11:10 AM EDT by telephone and verified that I am speaking with the correct person using two identifiers.   I discussed the limitations, risks, security and privacy concerns of performing an evaluation and management service by telephone and the availability of in person appointments. I also discussed with the patient that there may be a patient responsible charge related to this service. The patient expressed understanding and agreed to proceed.  Patient location:  Parking lot My Location:  [place] office Persons on the call:  Me and the patient.     History of Present Illness:  med RF and bloodwork needed today.  Doesn't check his blood sugar at home.  No concerns or complaints.  No CP/SOB/dizziness.  No symptoms hyper/hypoglycemia.      Observations/Objective:  A&Ox3   Assessment and Plan: 1. Prediabetes - Hemoglobin A1c - Lipid panel - metFORMIN (GLUCOPHAGE) 500 MG tablet; Take 1 tablet (500 mg total) by mouth daily with breakfast.  Dispense: 30 tablet; Refill: 6 I have had a lengthy discussion and provided education about insulin resistance and the intake of too much sugar/refined carbohydrates.  I have advised the patient to work at a goal of eliminating sugary drinks, candy, desserts, sweets, refined sugars, processed foods, and white carbohydrates.  The patient expresses understanding.    2. Essential hypertension - Comprehensive metabolic panel - CBC with Differential/Platelet - lisinopril-hydrochlorothiazide (ZESTORETIC) 10-12.5 MG tablet; Take 1 tablet by mouth daily.  Dispense: 30 tablet; Refill: 5 We have discussed target BP range and blood pressure goal. I have advised patient to check BP regularly and to call us back or report to clinic if the numbers are consistently higher than 140/90. We discussed the importance of compliance with medical therapy and DASH diet recommended, consequences of  uncontrolled hypertension discussed.     3. Anemia, unspecified type    Follow Up Instructions: Will assess control with labs and have patient see PCP in 3 months;  Sooner if needed.     I discussed the assessment and treatment plan with the patient. The patient was provided an opportunity to ask questions and all were answered. The patient agreed with the plan and demonstrated an understanding of the instructions.   The patient was advised to call back or seek an in-person evaluation if the symptoms worsen or if the condition fails to improve as anticipated.  I provided 12 minutes of non-face-to-face time during this encounter.   Freeman Caldron, PA-C  Patient ID: Matthew Wiggins, male   DOB: 1985/05/27, 33 y.o.   MRN: [id number]

## 2018-09-24 ENCOUNTER — Ambulatory Visit: Payer: Self-pay | Attending: Internal Medicine | Admitting: Physician Assistant

## 2018-09-24 ENCOUNTER — Other Ambulatory Visit: Payer: Self-pay

## 2018-09-24 ENCOUNTER — Ambulatory Visit: Payer: Self-pay

## 2018-09-24 DIAGNOSIS — D649 Anemia, unspecified: Secondary | ICD-10-CM

## 2018-09-24 DIAGNOSIS — R7303 Prediabetes: Secondary | ICD-10-CM

## 2018-09-24 DIAGNOSIS — I10 Essential (primary) hypertension: Secondary | ICD-10-CM

## 2018-09-24 MED ORDER — METFORMIN HCL 500 MG PO TABS: 500.0000 mg | ORAL_TABLET | Freq: Every day | ORAL | 6 refills | Status: DC

## 2018-09-24 MED ORDER — LISINOPRIL-HYDROCHLOROTHIAZIDE 10-12.5 MG PO TABS: 1.0000 | ORAL_TABLET | Freq: Every day | ORAL | 5 refills | Status: DC

## 2018-09-24 MED FILL — LISINOPRIL-HCTZ 10-12.5 MG: 10-12.5 | 30 days supply | Qty: 30 | Fill #0

## 2018-09-24 MED FILL — metFORMIN HCL 500 MG TABS: 500 | 30 days supply | Qty: 30 | Fill #0

## 2018-09-25 LAB — CBC WITH DIFFERENTIAL/PLATELET
Basophils Absolute: 0.1 10*3/uL (ref 0.0–0.2)
Basos: 1 %
EOS (ABSOLUTE): 0.1 10*3/uL (ref 0.0–0.4)
Eos: 2 %
Hematocrit: 39.7 % (ref 37.5–51.0)
Hemoglobin: 12.5 g/dL — ABNORMAL LOW (ref 13.0–17.7)
Immature Grans (Abs): 0 10*3/uL (ref 0.0–0.1)
Immature Granulocytes: 1 %
Lymphocytes Absolute: 1.6 10*3/uL (ref 0.7–3.1)
Lymphs: 25 %
MCH: 22.7 pg — ABNORMAL LOW (ref 26.6–33.0)
MCHC: 31.5 g/dL (ref 31.5–35.7)
MCV: 72 fL — ABNORMAL LOW (ref 79–97)
Monocytes Absolute: 0.7 10*3/uL (ref 0.1–0.9)
Monocytes: 11 %
Neutrophils Absolute: 4 10*3/uL (ref 1.4–7.0)
Neutrophils: 60 %
Platelets: 382 10*3/uL (ref 150–450)
RBC: 5.5 x10E6/uL (ref 4.14–5.80)
RDW: 15.8 % — ABNORMAL HIGH (ref 11.6–15.4)
WBC: 6.5 10*3/uL (ref 3.4–10.8)

## 2018-09-25 LAB — COMPREHENSIVE METABOLIC PANEL
ALT: 16 IU/L (ref 0–44)
AST: 15 IU/L (ref 0–40)
Albumin/Globulin Ratio: 1.1 — ABNORMAL LOW (ref 1.2–2.2)
Albumin: 4.1 g/dL (ref 4.0–5.0)
Alkaline Phosphatase: 99 IU/L (ref 39–117)
BUN/Creatinine Ratio: 11 (ref 9–20)
BUN: 11 mg/dL (ref 6–20)
Bilirubin Total: <0.2 mg/dL (ref 0.0–1.2)
CO2: 19 mmol/L — ABNORMAL LOW (ref 20–29)
Calcium: 8.8 mg/dL (ref 8.7–10.2)
Chloride: 102 mmol/L (ref 96–106)
Creatinine, Ser: 1.01 mg/dL (ref 0.76–1.27)
GFR calc Af Amer: 112 mL/min/{1.73_m2} (ref >59)
GFR calc non Af Amer: 97 mL/min/{1.73_m2} (ref >59)
Globulin, Total: 3.7 g/dL (ref 1.5–4.5)
Glucose: 136 mg/dL — ABNORMAL HIGH (ref 65–99)
Potassium: 4.6 mmol/L (ref 3.5–5.2)
Sodium: 140 mmol/L (ref 134–144)
Total Protein: 7.8 g/dL (ref 6.0–8.5)

## 2018-09-25 LAB — LIPID PANEL
Chol/HDL Ratio: 3.8 ratio (ref 0.0–5.0)
Cholesterol, Total: 156 mg/dL (ref 100–199)
HDL: 41 mg/dL (ref >39)
LDL Chol Calc (NIH): 104 mg/dL — ABNORMAL HIGH (ref 0–99)
Triglycerides: 53 mg/dL (ref 0–149)
VLDL Cholesterol Cal: 11 mg/dL (ref 5–40)

## 2018-09-25 LAB — HEMOGLOBIN A1C
Est. average glucose Bld gHb Est-mCnc: 146 mg/dL
Hgb A1c MFr Bld: 6.7 % — ABNORMAL HIGH (ref 4.8–5.6)

## 2018-11-20 ENCOUNTER — Ambulatory Visit (HOSPITAL_BASED_OUTPATIENT_CLINIC_OR_DEPARTMENT_OTHER): Payer: Self-pay | Admitting: Pharmacist

## 2018-11-20 ENCOUNTER — Other Ambulatory Visit: Payer: Self-pay

## 2018-11-20 ENCOUNTER — Ambulatory Visit: Payer: Self-pay | Attending: Internal Medicine | Admitting: Internal Medicine

## 2018-11-20 ENCOUNTER — Encounter: Payer: Self-pay | Admitting: Internal Medicine

## 2018-11-20 VITALS — BP 164/112 | HR 100 | Temp 98.2°F | Resp 16 | Wt >= 6400 oz

## 2018-11-20 DIAGNOSIS — E119 Type 2 diabetes mellitus without complications: Secondary | ICD-10-CM

## 2018-11-20 DIAGNOSIS — Z23 Encounter for immunization: Secondary | ICD-10-CM

## 2018-11-20 DIAGNOSIS — G4733 Obstructive sleep apnea (adult) (pediatric): Secondary | ICD-10-CM

## 2018-11-20 DIAGNOSIS — R7303 Prediabetes: Secondary | ICD-10-CM

## 2018-11-20 LAB — GLUCOSE, POCT (MANUAL RESULT ENTRY): POC Glucose: 131 mg/dl — AB (ref 70–99)

## 2018-11-20 MED ORDER — TRUE METRIX METER W/DEVICE KIT: PACK | 0 refills | Status: DC

## 2018-11-20 MED ORDER — METFORMIN HCL 500 MG PO TABS: 500.0000 mg | ORAL_TABLET | Freq: Every day | ORAL | 6 refills | Status: DC

## 2018-11-20 MED ORDER — TRUEPLUS LANCETS 28G MISC: 4 refills | Status: DC

## 2018-11-20 MED ORDER — TRUE METRIX BLOOD GLUCOSE TEST VI STRP: ORAL_STRIP | 12 refills | Status: DC

## 2018-11-20 MED FILL — metFORMIN HCL 500 MG TABS: 500 | 30 days supply | Qty: 30 | Fill #0

## 2018-11-20 MED FILL — TRUE METRIX GLUCOSE TEST ST: 33 days supply | Qty: 100 | Fill #0

## 2018-11-20 MED FILL — !TRUE METRIX BLOOD GLUCOSE: 365 days supply | Qty: 1 | Fill #0

## 2018-11-20 MED FILL — TRUEplus LANCETS 28G MISC: 33 days supply | Qty: 100 | Fill #0

## 2018-11-20 NOTE — Progress Notes (Signed)
Patient ID: ANSHUL MEDDINGS, male    DOB: 1985-04-26  MRN: [id number]  CC: Hypertension and Diabetes (prediabetes)   Subjective: Matthew Wiggins is a 33 y.o. male who presents for chronic ds management.  Last saw me 12/2017 His concerns today include:  Patient with history of diabetes, HTN, obesity, mild anemia, OSA, HL  Patient saw our PA on 09/24/2018.  Baseline blood test revealed hemoglobin A1c of 6.7 putting him in the range for diabetes.  Previously he was prediabetic.  I had started him on Metformin last year but patient does not remember this medication and states that he has not been taking it.   Eating habits:  Drinking more water.  Otherwise he is not able to tell me any other healthy changes he is made in his diet Exercise: Not getting in much exercise.  He attributes this to the Covid pandemic Denies any blurred vision.  He wears prescription glasses.  Last eye exam was about a year ago. Denies any numbness or tingling in the hands or feet.  HYPERTENSION Currently taking: see medication list Med Adherence: _0  Yes but did not take as yet for today    _1  No Medication side effects: _2  Yes    _3  No Adherence with salt restriction: _4  Yes    _5  No Home Monitoring?: _6  Yes    _7  No Monitoring Frequency: _8  Yes    _9  No Home BP results range: _10  Yes    _11  No SOB? _12  Yes    _13  No Chest Pain?: _14  Yes    _15  No Leg swelling?: _16  Yes - sometimes in the ankles   _17  No Headaches?: _18  Yes    _19  No Dizziness? _20  Yes    _21  No Comments:   OSA:  Using CPAP but feels mask to small "It sucks into my face."  Patient Active Problem List   Diagnosis Date Noted  . OSA (obstructive sleep apnea) 12/06/2017  . Microcytic anemia 10/04/2017  . Tachycardia 10/04/2017  . Morbid obesity ([place]) 08/22/2017  . Essential hypertension 08/22/2017  . Post-traumatic osteoarthritis of right knee 08/22/2017  . Prediabetes 08/22/2017     Current Outpatient Medications on File Prior to  Visit  Medication Sig Dispense Refill  . lisinopril-hydrochlorothiazide (ZESTORETIC) 10-12.5 MG tablet Take 1 tablet by mouth daily. 30 tablet 5   No current facility-administered medications on file prior to visit.     Allergies  Allergen Reactions  . Amoxicillin     Social History   Socioeconomic History  . Marital status: Single    Spouse name: Not on file  . Number of children: Not on file  . Years of education: Not on file  . Highest education level: Not on file  Occupational History  . Occupation: unemployed  Social Needs  . Financial resource strain: Not on file  . Food insecurity    Worry: Not on file    Inability: Not on file  . Transportation needs    Medical: Not on file    Non-medical: Not on file  Tobacco Use  . Smoking status: Never Smoker  . Smokeless tobacco: Never Used  Substance and Sexual Activity  . Alcohol use: No  . Drug use: No  . Sexual activity: Not on file  Lifestyle  . Physical activity    Days per week: Not on file    Minutes per session: Not on file  . Stress: Not on file  Relationships  . Social connections  Talks on phone: Not on file    Gets together: Not on file    Attends religious service: Not on file    Active member of club or organization: Not on file    Attends meetings of clubs or organizations: Not on file    Relationship status: Not on file  . Intimate partner violence    Fear of current or ex partner: Not on file    Emotionally abused: Not on file    Physically abused: Not on file    Forced sexual activity: Not on file  Other Topics Concern  . Not on file  Social History Narrative  . Not on file    Family History  Problem Relation Age of Onset  . Diabetes Maternal Grandmother   . Hypertension Maternal Grandmother     No past surgical history on file.  ROS: Review of Systems Negative except as stated above  PHYSICAL EXAM: BP (!) 164/112   Pulse 100   Temp 98.2 F (36.8 C) (Oral)   Resp 16   Wt  (!) 537 lb 6.4 oz (243.8 kg)   SpO2 98%   BMI 72.88 kg/m   Physical Exam General appearance - alert, well appearing, morbidly obese young African-American male and in no distress Mental status - normal mood, behavior, speech, dress, motor activity, and thought processes Neck - supple, no significant adenopathy Chest - clear to auscultation, no wheezes, rales or rhonchi, symmetric air entry Heart - normal rate, regular rhythm, normal S1, S2, no murmurs, rubs, clicks or gallops Extremities - peripheral pulses normal, no pedal edema, no clubbing or cyanosis Diabetic Foot Exam - Simple   Simple Foot Form Visual Inspection See comments: Yes Sensation Testing Intact to touch and monofilament testing bilaterally: Yes Pulse Check Posterior Tibialis and Dorsalis pulse intact bilaterally: Yes Comments Patient is flat-footed.  Skin on the soles of feet very dry     CMP Latest Ref Rng & Units 09/24/2018 10/04/2017 08/22/2017  Glucose 65 - 99 mg/dL 136(H) 108(H) 143(H)  BUN 6 - 20 mg/dL _0 Creatinine 0.76 - 1.27 mg/dL 1.01 0.87 0.95  Sodium 134 - 144 mmol/L 140 135 139  Potassium 3.5 - 5.2 mmol/L 4.6 4.2 4.7  Chloride 96 - 106 mmol/L 102 96 101  CO2 20 - 29 mmol/L 19(L) 24 20  Calcium 8.7 - 10.2 mg/dL 8.8 9.3 9.2  Total Protein 6.0 - 8.5 g/dL 7.8 - 7.9  Total Bilirubin 0.0 - 1.2 mg/dL <0.2 - 0.3  Alkaline Phos 39 - 117 IU/L 99 - 92  AST 0 - 40 IU/L 15 - 15  ALT 0 - 44 IU/L 16 - 17   Lipid Panel     Component Value Date/Time   CHOL 156 09/24/2018 1202   TRIG 53 09/24/2018 1202   HDL 41 09/24/2018 1202   CHOLHDL 3.8 09/24/2018 1202   LDLCALC 104 (H) 09/24/2018 1202    CBC    Component Value Date/Time   WBC 6.5 09/24/2018 1202   RBC 5.50 09/24/2018 1202   HGB 12.5 (L) 09/24/2018 1202   HCT 39.7 09/24/2018 1202   PLT 382 09/24/2018 1202   MCV 72 (L) 09/24/2018 1202   MCH 22.7 (L) 09/24/2018 1202   MCHC 31.5 09/24/2018 1202   RDW 15.8 (H) 09/24/2018 1202   LYMPHSABS 1.6  09/24/2018 1202   EOSABS 0.1 09/24/2018 1202   BASOSABS 0.1 09/24/2018 1202   Lab Results  Component Value Date   HGBA1C 6.7 (H)  09/24/2018   Results for orders placed or performed in visit on 11/20/18  Glucose (CBG)  Result Value Ref Range   POC Glucose 131 (A) 70 - 99 mg/dl    ASSESSMENT AND PLAN: 1. Type 2 diabetes mellitus without complication, without long-term current use of insulin ([place]) I personally did some teaching with the patient that included information of what his diabetes, how is it managed, complications that can occur from uncontrolled diabetes.  We discussed the importance of regular exercise and healthy eating habits.  Counseling given on healthy eating habits and referral submitted to nutritionist.  Printed information also given to the patient - Glucose (CBG) - metFORMIN (GLUCOPHAGE) 500 MG tablet; Take 1 tablet (500 mg total) by mouth daily with breakfast.  Dispense: 30 tablet; Refill: 6 - glucose blood (TRUE METRIX BLOOD GLUCOSE TEST) test strip; Use as instructed  Dispense: 100 each; Refill: 12 - Blood Glucose Monitoring Suppl (TRUE METRIX METER) w/Device KIT; Use as directed  Dispense: 1 kit; Refill: 0 - TRUEplus Lancets 28G MISC; Use as directed  Dispense: 100 each; Refill: 4 - Amb ref to Medical Nutrition Therapy-MNT  2. OSA (obstructive sleep apnea) We will try to get him back to the sleep lab to be fitted for a new mask.  In the meantime encourage him to remain compliant with using CPAP. - Desensitization mask fit; Future  3. Morbid obesity ([place]) See #1 above - Amb ref to Medical Nutrition Therapy-MNT  4. Need for influenza vaccination Given     Patient was given the opportunity to ask questions.  Patient verbalized understanding of the plan and was able to repeat key elements of the plan.   Orders Placed This Encounter  Procedures  . Amb ref to Medical Nutrition Therapy-MNT  . Glucose (CBG)  . Desensitization mask fit     Requested  Prescriptions   Signed Prescriptions Disp Refills  . metFORMIN (GLUCOPHAGE) 500 MG tablet 30 tablet 6    Sig: Take 1 tablet (500 mg total) by mouth daily with breakfast.  . glucose blood (TRUE METRIX BLOOD GLUCOSE TEST) test strip 100 each 12    Sig: Use as instructed  . Blood Glucose Monitoring Suppl (TRUE METRIX METER) w/Device KIT 1 kit 0    Sig: Use as directed  . TRUEplus Lancets 28G MISC 100 each 4    Sig: Use as directed    Return in about 4 months (around 03/20/2019).  Karle Plumber, MD, FACP

## 2018-11-20 NOTE — Progress Notes (Signed)
Patient presents for vaccination against influenza per orders of Dr. Johnson. Consent given. Counseling provided. No contraindications exists. Vaccine administered without incident.   

## 2018-11-20 NOTE — Patient Instructions (Addendum)
Please give patient an appointment with the clinical pharmacist in 2 weeks for repeat blood pressure check.      Diabetes Mellitus and Standards of Medical Care Managing diabetes (diabetes mellitus) can be complicated. Your diabetes treatment may be managed by a team of health care providers, including:  A physician who specializes in diabetes (endocrinologist).  A nurse practitioner or physician assistant.  Nurses.  A diet and nutrition specialist (registered dietitian).  A certified diabetes educator (CDE).  An exercise specialist.  A pharmacist.  An eye doctor.  A foot specialist (podiatrist).  A dentist.  A primary care provider.  A mental health provider. Your health care providers follow guidelines to help you get the best quality of care. The following schedule is a general guideline for your diabetes management plan. Your health care providers may give you more specific instructions. Physical exams Upon being diagnosed with diabetes mellitus, and each year after that, your health care provider will ask about your medical and family history. He or she will also do a physical exam. Your exam may include:  Measuring your height, weight, and body mass index (BMI).  Checking your blood pressure. This will be done at every routine medical visit. Your target blood pressure may vary depending on your medical conditions, your age, and other factors.  Thyroid gland exam.  Skin exam.  Screening for damage to your nerves (peripheral neuropathy). This may include checking the pulse in your legs and feet and checking the level of sensation in your hands and feet.  A complete foot exam to inspect the structure and skin of your feet, including checking for cuts, bruises, redness, blisters, sores, or other problems.  Screening for blood vessel (vascular) problems, which may include checking the pulse in your legs and feet and checking your temperature. Blood tests Depending on  your treatment plan and your personal needs, you may have the following tests done:  HbA1c (hemoglobin A1c). This test provides information about blood sugar (glucose) control over the previous 2-3 months. It is used to adjust your treatment plan, if needed. This test will be done: ? At least 2 times a year, if you are meeting your treatment goals. ? 4 times a year, if you are not meeting your treatment goals or if treatment goals have changed.  Lipid testing, including total, LDL, and HDL cholesterol and triglyceride levels. ? The goal for LDL is less than 100 mg/dL (5.5 mmol/L). If you are at high risk for complications, the goal is less than 70 mg/dL (3.9 mmol/L). ? The goal for HDL is 40 mg/dL (2.2 mmol/L) or higher for men and 50 mg/dL (2.8 mmol/L) or higher for women. An HDL cholesterol of 60 mg/dL (3.3 mmol/L) or higher gives some protection against heart disease. ? The goal for triglycerides is less than 150 mg/dL (8.3 mmol/L).  Liver function tests.  Kidney function tests.  Thyroid function tests. Dental and eye exams  Visit your dentist two times a year.  If you have type 1 diabetes, your health care provider may recommend an eye exam 3-5 years after you are diagnosed, and then once a year after your first exam. ? For children with type 1 diabetes, a health care provider may recommend an eye exam when your child is age 47 or older and has had diabetes for 3-5 years. After the first exam, your child should get an eye exam once a year.  If you have type 2 diabetes, your health care provider may recommend  an eye exam as soon as you are diagnosed, and then once a year after your first exam. Immunizations   The yearly flu (influenza) vaccine is recommended for everyone 6 months or older who has diabetes.  The pneumonia (pneumococcal) vaccine is recommended for everyone 2 years or older who has diabetes. If you are 2965 or older, you may get the pneumonia vaccine as a series of two  separate shots.  The hepatitis B vaccine is recommended for adults shortly after being diagnosed with diabetes.  Adults and children with diabetes should receive all other vaccines according to age-specific recommendations from the Centers for Disease Control and Prevention (CDC). Mental and emotional health Screening for symptoms of eating disorders, anxiety, and depression is recommended at the time of diagnosis and afterward as needed. If your screening shows that you have symptoms (positive screening result), you may need more evaluation and you may work with a mental health care provider. Treatment plan Your treatment plan will be reviewed at every medical visit. You and your health care provider will discuss:  How you are taking your medicines, including insulin.  Any side effects you are experiencing.  Your blood glucose target goals.  The frequency of your blood glucose monitoring.  Lifestyle habits, such as activity level as well as tobacco, alcohol, and substance use. Diabetes self-management education Your health care provider will assess how well you are monitoring your blood glucose levels and whether you are taking your insulin correctly. He or she may refer you to:  A certified diabetes educator to manage your diabetes throughout your life, starting at diagnosis.  A registered dietitian who can create or review your personal nutrition plan.  An exercise specialist who can discuss your activity level and exercise plan. Summary  Managing diabetes (diabetes mellitus) can be complicated. Your diabetes treatment may be managed by a team of health care providers.  Your health care providers follow guidelines in order to help you get the best quality of care.  Standards of care including having regular physical exams, blood tests, blood pressure monitoring, immunizations, screening tests, and education about how to manage your diabetes.  Your health care providers may also  give you more specific instructions based on your individual health. This information is not intended to replace advice given to you by your health care provider. Make sure you discuss any questions you have with your health care provider. Document Released: 10/15/2008 Document Revised: 09/06/2017 Document Reviewed: 09/16/2015 Elsevier Patient Education  2020 [employer]  Diabetes Mellitus and Nutrition, Adult When you have diabetes (diabetes mellitus), it is very important to have healthy eating habits because your blood sugar (glucose) levels are greatly affected by what you eat and drink. Eating healthy foods in the appropriate amounts, at about the same times every day, can help you:  Control your blood glucose.  Lower your risk of heart disease.  Improve your blood pressure.  Reach or maintain a healthy weight. Every person with diabetes is different, and each person has different needs for a meal plan. Your health care provider may recommend that you work with a diet and nutrition specialist (dietitian) to make a meal plan that is best for you. Your meal plan may vary depending on factors such as:  The calories you need.  The medicines you take.  Your weight.  Your blood glucose, blood pressure, and cholesterol levels.  Your activity level.  Other health conditions you have, such as heart or kidney disease. How do carbohydrates affect  me? Carbohydrates, also called carbs, affect your blood glucose level more than any other type of food. Eating carbs naturally raises the amount of glucose in your blood. Carb counting is a method for keeping track of how many carbs you eat. Counting carbs is important to keep your blood glucose at a healthy level, especially if you use insulin or take certain oral diabetes medicines. It is important to know how many carbs you can safely have in each meal. This is different for every person. Your dietitian can help you calculate how many carbs you  should have at each meal and for each snack. Foods that contain carbs include:  Bread, cereal, rice, pasta, and crackers.  Potatoes and corn.  Peas, beans, and lentils.  Milk and yogurt.  Fruit and juice.  Desserts, such as cakes, cookies, ice cream, and candy. How does alcohol affect me? Alcohol can cause a sudden decrease in blood glucose (hypoglycemia), especially if you use insulin or take certain oral diabetes medicines. Hypoglycemia can be a life-threatening condition. Symptoms of hypoglycemia (sleepiness, dizziness, and confusion) are similar to symptoms of having too much alcohol. If your health care provider says that alcohol is safe for you, follow these guidelines:  Limit alcohol intake to no more than 1 drink per day for nonpregnant women and 2 drinks per day for men. One drink equals 12 oz of beer, 5 oz of wine, or 1 oz of hard liquor.  Do not drink on an empty stomach.  Keep yourself hydrated with water, diet soda, or unsweetened iced tea.  Keep in mind that regular soda, juice, and other mixers may contain a lot of sugar and must be counted as carbs. What are tips for following this plan?  Reading food labels  Start by checking the serving size on the "Nutrition Facts" label of packaged foods and drinks. The amount of calories, carbs, fats, and other nutrients listed on the label is based on one serving of the item. Many items contain more than one serving per package.  Check the total grams (g) of carbs in one serving. You can calculate the number of servings of carbs in one serving by dividing the total carbs by 15. For example, if a food has 30 g of total carbs, it would be equal to 2 servings of carbs.  Check the number of grams (g) of saturated and trans fats in one serving. Choose foods that have low or no amount of these fats.  Check the number of milligrams (mg) of salt (sodium) in one serving. Most people should limit total sodium intake to less than 2,300  mg per day.  Always check the nutrition information of foods labeled as "low-fat" or "nonfat". These foods may be higher in added sugar or refined carbs and should be avoided.  Talk to your dietitian to identify your daily goals for nutrients listed on the label. Shopping  Avoid buying canned, premade, or processed foods. These foods tend to be high in fat, sodium, and added sugar.  Shop around the outside edge of the grocery store. This includes fresh fruits and vegetables, bulk grains, fresh meats, and fresh dairy. Cooking  Use low-heat cooking methods, such as baking, instead of high-heat cooking methods like deep frying.  Cook using healthy oils, such as olive, canola, or sunflower oil.  Avoid cooking with butter, cream, or high-fat meats. Meal planning  Eat meals and snacks regularly, preferably at the same times every day. Avoid going long periods of time  without eating.  Eat foods high in fiber, such as fresh fruits, vegetables, beans, and whole grains. Talk to your dietitian about how many servings of carbs you can eat at each meal.  Eat 4-6 ounces (oz) of lean protein each day, such as lean meat, chicken, fish, eggs, or tofu. One oz of lean protein is equal to: ? 1 oz of meat, chicken, or fish. ? 1 egg. ?  cup of tofu.  Eat some foods each day that contain healthy fats, such as avocado, nuts, seeds, and fish. Lifestyle  Check your blood glucose regularly.  Exercise regularly as told by your health care provider. This may include: ? 150 minutes of moderate-intensity or vigorous-intensity exercise each week. This could be brisk walking, biking, or water aerobics. ? Stretching and doing strength exercises, such as yoga or weightlifting, at least 2 times a week.  Take medicines as told by your health care provider.  Do not use any products that contain nicotine or tobacco, such as cigarettes and e-cigarettes. If you need help quitting, ask your health care  provider.  Work with a Veterinary surgeon or diabetes educator to identify strategies to manage stress and any emotional and social challenges. Questions to ask a health care provider  Do I need to meet with a diabetes educator?  Do I need to meet with a dietitian?  What number can I call if I have questions?  When are the best times to check my blood glucose? Where to find more information:  American Diabetes Association: diabetes.org  Academy of Nutrition and Dietetics: www.eatright.[employer] of Diabetes and Digestive and Kidney Diseases (NIH): CarFlippers.tn Summary  A healthy meal plan will help you control your blood glucose and maintain a healthy lifestyle.  Working with a diet and nutrition specialist (dietitian) can help you make a meal plan that is best for you.  Keep in mind that carbohydrates (carbs) and alcohol have immediate effects on your blood glucose levels. It is important to count carbs and to use alcohol carefully. This information is not intended to replace advice given to you by your health care provider. Make sure you discuss any questions you have with your health care provider. Document Released: 09/14/2004 Document Revised: 11/30/2016 Document Reviewed: 01/23/2016 Elsevier Patient Education  2020 Elsevier Inc.   Influenza Virus Vaccine injection (Fluarix) What is this medicine? INFLUENZA VIRUS VACCINE (in floo EN zuh VAHY ruhs vak SEEN) helps to reduce the risk of getting influenza also known as the flu. This medicine may be used for other purposes; ask your health care provider or pharmacist if you have questions. COMMON BRAND NAME(S): Fluarix, Fluzone What should I tell my health care provider before I take this medicine? They need to know if you have any of these conditions:  bleeding disorder like hemophilia  fever or infection  Guillain-Barre syndrome or other neurological problems  immune system problems  infection with the human  immunodeficiency virus (HIV) or AIDS  low blood platelet counts  multiple sclerosis  an unusual or allergic reaction to influenza virus vaccine, eggs, chicken proteins, latex, gentamicin, other medicines, foods, dyes or preservatives  pregnant or trying to get pregnant  breast-feeding How should I use this medicine? This vaccine is for injection into a muscle. It is given by a health care professional. A copy of Vaccine Information Statements will be given before each vaccination. Read this sheet carefully each time. The sheet may change frequently. Talk to your pediatrician regarding the use of this  medicine in children. Special care may be needed. Overdosage: If you think you have taken too much of this medicine contact a poison control center or emergency room at once. NOTE: This medicine is only for you. Do not share this medicine with others. What if I miss a dose? This does not apply. What may interact with this medicine?  chemotherapy or radiation therapy  medicines that lower your immune system like etanercept, anakinra, infliximab, and adalimumab  medicines that treat or prevent blood clots like warfarin  phenytoin  steroid medicines like prednisone or cortisone  theophylline  vaccines This list may not describe all possible interactions. Give your health care provider a list of all the medicines, herbs, non-prescription drugs, or dietary supplements you use. Also tell them if you smoke, drink alcohol, or use illegal drugs. Some items may interact with your medicine. What should I watch for while using this medicine? Report any side effects that do not go away within 3 days to your doctor or health care professional. Call your health care provider if any unusual symptoms occur within 6 weeks of receiving this vaccine. You may still catch the flu, but the illness is not usually as bad. You cannot get the flu from the vaccine. The vaccine will not protect against colds or  other illnesses that may cause fever. The vaccine is needed every year. What side effects may I notice from receiving this medicine? Side effects that you should report to your doctor or health care professional as soon as possible:  allergic reactions like skin rash, itching or hives, swelling of the face, lips, or tongue Side effects that usually do not require medical attention (report to your doctor or health care professional if they continue or are bothersome):  fever  headache  muscle aches and pains  pain, tenderness, redness, or swelling at site where injected  weak or tired This list may not describe all possible side effects. Call your doctor for medical advice about side effects. You may report side effects to FDA at 1-800-FDA-1088. Where should I keep my medicine? This vaccine is only given in a clinic, pharmacy, doctor's office, or other health care setting and will not be stored at home. NOTE: This sheet is a summary. It may not cover all possible information. If you have questions about this medicine, talk to your doctor, pharmacist, or health care provider.  2020 Elsevier/Gold Standard (2007-07-16 09:30:40)

## 2018-12-05 ENCOUNTER — Other Ambulatory Visit: Payer: Self-pay | Admitting: Internal Medicine

## 2018-12-05 DIAGNOSIS — I10 Essential (primary) hypertension: Secondary | ICD-10-CM

## 2018-12-09 ENCOUNTER — Ambulatory Visit: Payer: Self-pay | Admitting: Pharmacist

## 2018-12-10 MED FILL — LISINOPRIL-HCTZ 10-12.5 MG: 10-12.5 | 30 days supply | Qty: 30 | Fill #0

## 2018-12-19 ENCOUNTER — Ambulatory Visit: Payer: Self-pay | Admitting: Registered"

## 2019-02-09 MED FILL — LISINOPRIL-HCTZ 10-12.5 MG: 10-12.5 | 30 days supply | Qty: 30 | Fill #0

## 2019-02-09 MED FILL — metFORMIN HCL 500 MG TABS: 500 | 30 days supply | Qty: 30 | Fill #1

## 2019-03-20 ENCOUNTER — Ambulatory Visit: Payer: Self-pay | Admitting: Internal Medicine
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
## 2020-04-21 ENCOUNTER — Other Ambulatory Visit (HOSPITAL_BASED_OUTPATIENT_CLINIC_OR_DEPARTMENT_OTHER): Payer: Self-pay | Admitting: Family Medicine

## 2020-04-21 ENCOUNTER — Other Ambulatory Visit (HOSPITAL_BASED_OUTPATIENT_CLINIC_OR_DEPARTMENT_OTHER): Payer: Self-pay

## 2020-04-21 ENCOUNTER — Ambulatory Visit (INDEPENDENT_AMBULATORY_CARE_PROVIDER_SITE_OTHER): Payer: 59 | Admitting: Family Medicine

## 2020-04-21 ENCOUNTER — Encounter (HOSPITAL_BASED_OUTPATIENT_CLINIC_OR_DEPARTMENT_OTHER): Payer: Self-pay | Admitting: Family Medicine

## 2020-04-21 ENCOUNTER — Other Ambulatory Visit: Payer: Self-pay

## 2020-04-21 ENCOUNTER — Other Ambulatory Visit (HOSPITAL_BASED_OUTPATIENT_CLINIC_OR_DEPARTMENT_OTHER)
Admission: RE | Admit: 2020-04-21 | Discharge: 2020-04-21 | Disposition: A | Discharge status: Home / Self Care | Payer: 59 | Source: Ambulatory Visit | Attending: Family Medicine | Admitting: Family Medicine

## 2020-04-21 ENCOUNTER — Telehealth (HOSPITAL_BASED_OUTPATIENT_CLINIC_OR_DEPARTMENT_OTHER): Payer: Self-pay

## 2020-04-21 VITALS — BP 148/102 | HR 120 | Ht 72.0 in | Wt >= 6400 oz

## 2020-04-21 DIAGNOSIS — I10 Essential (primary) hypertension: Secondary | ICD-10-CM | POA: Diagnosis present

## 2020-04-21 DIAGNOSIS — E119 Type 2 diabetes mellitus without complications: Secondary | ICD-10-CM

## 2020-04-21 DIAGNOSIS — E1169 Type 2 diabetes mellitus with other specified complication: Secondary | ICD-10-CM | POA: Insufficient documentation

## 2020-04-21 DIAGNOSIS — D509 Iron deficiency anemia, unspecified: Secondary | ICD-10-CM

## 2020-04-21 LAB — COMPREHENSIVE METABOLIC PANEL
ALT: 16 U/L (ref 0–44)
AST: 10 U/L — ABNORMAL LOW (ref 15–41)
Albumin: 3.9 g/dL (ref 3.5–5.0)
Alkaline Phosphatase: 87 U/L (ref 38–126)
Anion gap: 9 (ref 5–15)
BUN: 8 mg/dL (ref 6–20)
CO2: 27 mmol/L (ref 22–32)
Calcium: 9.6 mg/dL (ref 8.9–10.3)
Chloride: 98 mmol/L (ref 98–111)
Creatinine, Ser: 0.79 mg/dL (ref 0.61–1.24)
GFR, Estimated: >60 mL/min (ref >60)
Glucose, Bld: 299 mg/dL — ABNORMAL HIGH (ref 70–99)
Potassium: 4.1 mmol/L (ref 3.5–5.1)
Sodium: 134 mmol/L — ABNORMAL LOW (ref 135–145)
Total Bilirubin: 0.5 mg/dL (ref 0.3–1.2)
Total Protein: 8.1 g/dL (ref 6.5–8.1)

## 2020-04-21 LAB — CBC WITH DIFFERENTIAL/PLATELET
Abs Immature Granulocytes: 0.03 10*3/uL (ref 0.00–0.07)
Basophils Absolute: 0.1 10*3/uL (ref 0.0–0.1)
Basophils Relative: 1 %
Eosinophils Absolute: 0.2 10*3/uL (ref 0.0–0.5)
Eosinophils Relative: 2 %
HCT: 40.7 % (ref 39.0–52.0)
Hemoglobin: 12.4 g/dL — ABNORMAL LOW (ref 13.0–17.0)
Immature Granulocytes: 0 %
Lymphocytes Relative: 27 %
Lymphs Abs: 2.1 10*3/uL (ref 0.7–4.0)
MCH: 21.8 pg — ABNORMAL LOW (ref 26.0–34.0)
MCHC: 30.5 g/dL (ref 30.0–36.0)
MCV: 71.4 fL — ABNORMAL LOW (ref 80.0–100.0)
Monocytes Absolute: 0.8 10*3/uL (ref 0.1–1.0)
Monocytes Relative: 10 %
Neutro Abs: 4.7 10*3/uL (ref 1.7–7.7)
Neutrophils Relative %: 60 %
Platelets: 435 10*3/uL — ABNORMAL HIGH (ref 150–400)
RBC: 5.7 MIL/uL (ref 4.22–5.81)
RDW: 17.4 % — ABNORMAL HIGH (ref 11.5–15.5)
WBC: 7.9 10*3/uL (ref 4.0–10.5)
nRBC: 0 % (ref 0.0–0.2)

## 2020-04-21 LAB — POCT UA - MICROALBUMIN
Creatinine, POC: 300 mg/dL
Microalbumin Ur, POC: 150 mg/L

## 2020-04-21 LAB — FERRITIN: Ferritin: 202 ng/mL (ref 24–336)

## 2020-04-21 LAB — LIPID PANEL
Cholesterol: 129 mg/dL (ref 0–200)
HDL: 31 mg/dL — ABNORMAL LOW (ref >40)
LDL Cholesterol: 80 mg/dL (ref 0–99)
Total CHOL/HDL Ratio: 4.2 RATIO
Triglycerides: 88 mg/dL (ref <150)
VLDL: 18 mg/dL (ref 0–40)

## 2020-04-21 LAB — IRON AND TIBC
Iron: 35 ug/dL — ABNORMAL LOW (ref 45–182)
Saturation Ratios: 11 % — ABNORMAL LOW (ref 17.9–39.5)
TIBC: 309 ug/dL (ref 250–450)
UIBC: 274 ug/dL

## 2020-04-21 LAB — HEMOGLOBIN A1C
Hgb A1c MFr Bld: 13 % — ABNORMAL HIGH (ref 4.8–5.6)
Mean Plasma Glucose: 326.4 mg/dL

## 2020-04-21 MED ORDER — METFORMIN HCL 500 MG PO TABS
1000.0000 mg | ORAL_TABLET | Freq: Two times a day (BID) | ORAL | 0 refills | Status: DC
  Filled 2020-04-21: qty 120, 30d supply, fill #0

## 2020-04-21 MED ORDER — LISINOPRIL-HYDROCHLOROTHIAZIDE 10-12.5 MG PO TABS
1.0000 | ORAL_TABLET | Freq: Every day | ORAL | 2 refills | Status: DC
  Filled 2020-04-21: qty 30, 30d supply, fill #0
  Filled 2020-05-17: qty 30, 30d supply, fill #1
  Filled 2020-06-22: qty 30, 30d supply, fill #2

## 2020-04-21 NOTE — Addendum Note (Signed)
Addended by: Dareen Piano on: 04/21/2020 01:28 PM   Modules accepted: Orders

## 2020-04-21 NOTE — Progress Notes (Signed)
Orders added for iron studies due to finding of microcytic anemia and mild thrombocytosis

## 2020-04-21 NOTE — Assessment & Plan Note (Addendum)
Most recent hemoglobin A1c was in September 2020 at 6.7% Concern is is likely increased as patient has not had regular medical care and has been without metformin Will refill metformin, instructed on gradual titration to 1000 mg twice daily Will check hemoglobin A1c, microalbumin creatinine ratio today Plan for foot exam and referral to ophthalmology at next visit Will refer to nutritionist

## 2020-04-21 NOTE — Addendum Note (Signed)
Addended by: Dareen Piano on: 04/21/2020 11:38 AM   Modules accepted: Orders

## 2020-04-21 NOTE — Patient Instructions (Addendum)
  Medication Instructions:  Your physician has recommended you make the following change in your medication: -- INCREASE METFORMIN - Take 1 tablet (500 mg) by mouth twice daily for 7 days. Then INCREASE to 2 tablets (1000 mg) by mouth daily in the AM and 1 tablet (500 mg) by mouth daily in the PM for 7 days . Then INCREASE to 2 tablets (1000 mg) by mouth twice daily   --If you need a refill on any your medications before your next appointment, please call your pharmacy first. If no refills are authorized on file call the office.--  Lab Work: Your physician has recommended that you have lab work today: CBC, A1C, Lipid Profile, and Comprehensive Metabolic Profile If you have labs (blood work) drawn today and your tests are completely normal, you will receive your results only by: Marland Kitchen MyChart Message (if you have MyChart) OR . A phone call from our staff. Please ensure you check your voicemail in the event that you authorized detailed messages to be left on a delegated number. If you have any lab test that is abnormal or we need to change your treatment, we will call you to review the results.  Referrals/Procedures/Imaging: A referral has been placed for you to [facility] for dietary counseling. Someone from the scheduling department will be in contact with you in regards to coordinating your consultation. If you do not hear from any of the schedulers within 7-10 business days please give our office a call.  Follow-Up: Your next appointment:   Your physician recommends that you schedule a follow-up appointment in: 3-4 WEEKS with Dr. de Peru  Thanks for letting us be apart of your health journey!!  Primary Care and Sports Medicine   Dr. de [place] and Shawna Clamp, DNP, AGNP  We recommend signing up for the patient portal called "MyChart".  Sign up information is provided on this After Visit Summary.  MyChart is used to connect with patients for Virtual Visits (Telemedicine).   Patients are able to view lab/test results, encounter notes, upcoming appointments, etc.  Non-urgent messages can be sent to your provider as well.   To learn more about what you can do with MyChart, please visit --  [email or web address].

## 2020-04-21 NOTE — Assessment & Plan Note (Signed)
Discussed need for lifestyle modifications including need for healthy dietary changes, increase in physical activity working towards healthy weight loss Referral to nutritionist Discuss further medical interventions at next office visit Likely would also be a candidate to consult with bariatric surgery if interested in the future

## 2020-04-21 NOTE — Progress Notes (Signed)
New Patient Office Visit  Subjective:  Patient ID: Matthew Wiggins, male    DOB: 1985/01/09  Age: 35 y.o. MRN: [id number]  CC:  Chief Complaint  Patient presents with  . Establish Care  . Medication Refill    Pt hasnt been seen by a PCP in over a year and needs medication renewals    HPI Matthew Wiggins is a 34 year old male presenting to establish in clinic.  No specific concerns today.  Does have past medical history significant for diabetes, hypertension, sleep apnea, morbid obesity.  Patient has not seen a primary care physician in over a year and has been out of some of his medications for about the same amount of time.  Diabetes: Has been prescribed metformin, has not been taking this as he ran out of the medication.  Previously was taking 500 mg once daily.  Denies any GI issues, diarrhea, loose stools while taking the medication.  Most recent hemoglobin A1c was 6.7% in September 2020.  Denies any polyuria or polydipsia.  Hypertension: Was being managed on lisinopril-hydrochlorothiazide combination pill.  Reports that he has been on this medication for quite some time.  Does not check blood pressure at home, does not have blood pressure cuff at home.  Denies any issues with chest pain, shortness of breath, dizziness, lightheadedness, persistent headaches.  OSA: Thinks this was diagnosed about 2 to 3 years ago.  Denies having current CPAP machine, not using CPAP machine.  Past Medical History:  Diagnosis Date  . Diabetes mellitus without complication ([place])   . Hypertension     History reviewed. No pertinent surgical history.  Family History  Problem Relation Age of Onset  . Diabetes Maternal Grandmother   . Hypertension Maternal Grandmother     Social History   Socioeconomic History  . Marital status: Single    Spouse name: Not on file  . Number of children: Not on file  . Years of education: Not on file  . Highest education level: Not on file  Occupational  History  . Occupation: unemployed  Tobacco Use  . Smoking status: Never Smoker  . Smokeless tobacco: Never Used  Vaping Use  . Vaping Use: Never used  Substance and Sexual Activity  . Alcohol use: No  . Drug use: No  . Sexual activity: Not on file  Other Topics Concern  . Not on file  Social History Narrative  . Not on file   Social Determinants of Health   Financial Resource Strain: Not on file  Food Insecurity: Not on file  Transportation Needs: Not on file  Physical Activity: Not on file  Stress: Not on file  Social Connections: Not on file  Intimate Partner Violence: Not on file    Objective:   Today's Vitals: BP (!) 148/102   Pulse (!) 120   Ht 6' (1.829 m)   Wt (!) 527 lb 9.6 oz (239.3 kg)   SpO2 99%   BMI 71.56 kg/m   Physical Exam  Pleasant 35 year old male in no acute distress Cardiovascular exam with increased rate and regular rhythm, no murmurs appreciated Lungs clear to auscultation bilaterally  Assessment & Plan:   Problem List Items Addressed This Visit      Cardiovascular and Mediastinum   Essential hypertension - Primary    Chronic, not at goal, patient has not been taking prescribed medication as he has been out of this Will refill lisinopril and hydrochlorothiazide combo pill for patient to resume Discussed monitoring blood pressure  at home, will provide log Discussed lifestyle modifications including DASH diet, physical activity Will also refer to nutritionist Monitor blood pressure at future visits Check labs as below      Relevant Medications   lisinopril-hydrochlorothiazide (ZESTORETIC) 10-12.5 MG tablet   Other Relevant Orders   CBC with Differential/Platelet   Comprehensive metabolic panel   Lipid panel   Hemoglobin A1c   Amb ref to Medical Nutrition Therapy-[facility]     Endocrine   Diabetes ([place])    Most recent hemoglobin A1c was in September 2020 at 6.7% Concern is is likely increased as patient has not had regular medical  care and has been without metformin Will refill metformin, instructed on gradual titration to 1000 mg twice daily Will check hemoglobin A1c, microalbumin creatinine ratio today Plan for foot exam and referral to ophthalmology at next visit Will refer to nutritionist      Relevant Medications   metFORMIN (GLUCOPHAGE) 500 MG tablet   lisinopril-hydrochlorothiazide (ZESTORETIC) 10-12.5 MG tablet   Other Relevant Orders   CBC with Differential/Platelet   Comprehensive metabolic panel   Lipid panel   Hemoglobin A1c   Amb ref to Medical Nutrition Therapy-[facility]     Other   Morbid obesity ([place])    Discussed need for lifestyle modifications including need for healthy dietary changes, increase in physical activity working towards healthy weight loss Referral to nutritionist Discuss further medical interventions at next office visit Likely would also be a candidate to consult with bariatric surgery if interested in the future      Relevant Medications   metFORMIN (GLUCOPHAGE) 500 MG tablet   Other Relevant Orders   CBC with Differential/Platelet   Comprehensive metabolic panel   Lipid panel   Hemoglobin A1c   Amb ref to Medical Nutrition Therapy-[facility]    At next visit: Complete diabetic foot exam, referral to ophthalmology Further discuss sleep apnea, likely referral for sleep study  Outpatient Encounter Medications as of 04/21/2020  Medication Sig  . Blood Glucose Monitoring Suppl (TRUE METRIX METER) w/Device KIT Use as directed  . glucose blood (TRUE METRIX BLOOD GLUCOSE TEST) test strip Use as instructed  . metFORMIN (GLUCOPHAGE) 500 MG tablet Take 2 tablets (1,000 mg total) by mouth 2 (two) times daily with a meal.  . TRUEplus Lancets 28G MISC Use as directed  . [DISCONTINUED] lisinopril-hydrochlorothiazide (ZESTORETIC) 10-12.5 MG tablet Take 1 tablet by mouth daily.  . [DISCONTINUED] metFORMIN (GLUCOPHAGE) 500 MG tablet Take 1 tablet (500 mg total) by mouth daily with breakfast.  .  lisinopril-hydrochlorothiazide (ZESTORETIC) 10-12.5 MG tablet Take 1 tablet by mouth daily.   No facility-administered encounter medications on file as of 04/21/2020.   Spent 60 minutes on this patient encounter, including preparation, chart review, face-to-face counseling with patient and coordination of care, and documentation of encounter  Follow-up: Return in about 3 weeks (around 05/12/2020).   Kassim Guertin J [place], MD

## 2020-04-21 NOTE — Assessment & Plan Note (Signed)
Chronic, not at goal, patient has not been taking prescribed medication as he has been out of this Will refill lisinopril and hydrochlorothiazide combo pill for patient to resume Discussed monitoring blood pressure at home, will provide log Discussed lifestyle modifications including DASH diet, physical activity Will also refer to nutritionist Monitor blood pressure at future visits Check labs as below

## 2020-04-21 NOTE — Telephone Encounter (Signed)
Called patient to inform him that his A1C was extremely elevated from previous readings and he will need to come back into the office on Monday for follow up Per Dr. De Peru patient will need to be referred to diabetic nutrition services Referral placed and appt scheduled for 05/26 at 815 am

## 2020-04-21 NOTE — Telephone Encounter (Signed)
-----   Message from Hosie Poisson [place], MD sent at 04/21/2020 12:39 PM EDT ----- Moderate albuminuria noted. Evidence of impact of diabetes on kidney function.

## 2020-04-25 ENCOUNTER — Encounter (HOSPITAL_BASED_OUTPATIENT_CLINIC_OR_DEPARTMENT_OTHER): Payer: Self-pay | Admitting: Family Medicine

## 2020-04-25 ENCOUNTER — Ambulatory Visit (INDEPENDENT_AMBULATORY_CARE_PROVIDER_SITE_OTHER): Payer: 59 | Admitting: Family Medicine

## 2020-04-25 ENCOUNTER — Other Ambulatory Visit: Payer: Self-pay

## 2020-04-25 ENCOUNTER — Other Ambulatory Visit (HOSPITAL_BASED_OUTPATIENT_CLINIC_OR_DEPARTMENT_OTHER): Payer: Self-pay

## 2020-04-25 VITALS — BP 158/82 | HR 118 | Ht 72.0 in | Wt >= 6400 oz

## 2020-04-25 DIAGNOSIS — E1129 Type 2 diabetes mellitus with other diabetic kidney complication: Secondary | ICD-10-CM | POA: Diagnosis not present

## 2020-04-25 DIAGNOSIS — R809 Proteinuria, unspecified: Secondary | ICD-10-CM

## 2020-04-25 MED ORDER — BASAGLAR KWIKPEN 100 UNIT/ML ~~LOC~~ SOPN
10.0000 [IU] | PEN_INJECTOR | Freq: Every day | SUBCUTANEOUS | 1 refills | Status: DC
  Filled 2020-04-25: qty 15, 30d supply, fill #0

## 2020-04-25 MED ORDER — BASAGLAR KWIKPEN 100 UNIT/ML ~~LOC~~ SOPN: 10.0000 [IU] | PEN_INJECTOR | Freq: Every day | SUBCUTANEOUS | 1 refills | Status: DC

## 2020-04-25 MED ORDER — GLUCOSE BLOOD VI STRP
ORAL_STRIP | 0 refills | Status: DC
  Filled 2020-04-25: qty 100, 25d supply, fill #0

## 2020-04-25 MED ORDER — FREESTYLE LANCETS MISC
0 refills | Status: DC
  Filled 2020-04-25: qty 100, 25d supply, fill #0

## 2020-04-25 MED ORDER — BLOOD GLUCOSE METER KIT
PACK | 0 refills | Status: DC
  Filled 2020-04-25: qty 1, 30d supply, fill #0

## 2020-04-25 NOTE — Assessment & Plan Note (Signed)
Chronic, not at goal, hemoglobin A1c 13.0% Discussed lab results with patient and family, discussed concerns related to persistent elevated blood sugars, uncontrolled diabetes and potential for complications if not adequately controlled Discussed treatment recommendations given significantly elevated A1c Continue with gradual titration of metformin to 1000 mg twice daily Will also start on basal insulin, starting at 10 units once daily with gradual titration Patient will come in for nurse visit tomorrow in order to properly instruct on checking blood sugar as well as administering insulin Plan for close follow-up in 1 week to monitor progress, blood sugar log Will also complete foot exam, ophthalmology referral at next office visit as well as repeat blood pressure Discussed need for pneumococcal vaccine at next office visit

## 2020-04-25 NOTE — Patient Instructions (Signed)
  Medication Instructions:  Your physician has recommended you make the following change in your medication: -- START Lantus Solostar - Inject 10 units subcutaneously for 3 days. Every 3 days, if fasting blood sugar remains above 130 increase injection by 2 units. Ex - Tuesday Blood sugar is 199 - inject 10 units Tuesday, Wednesday, and Thursday. Thursday blood sugar is 175 inject 12 units for Thursday, Friday, Saturday. Follow that pattern until blood sugar is consistently below 130. --If you need a refill on any your medications before your next appointment, please call your pharmacy first. If no refills are authorized on file call the office.--  Follow-Up: Your next appointment:   Your physician recommends that you schedule a NURSE VISIT tomorrow to start insulin injections  Your physician recommends that you schedule a follow-up appointment in: 1 WEEK with Dr. de Peru  Thanks for letting us be apart of your health journey!!  Primary Care and Sports Medicine   Dr. de Peru and Shawna Clamp, DNP, AGNP  We recommend signing up for the patient portal called "MyChart".  Sign up information is provided on this After Visit Summary.  MyChart is used to connect with patients for Virtual Visits (Telemedicine).  Patients are able to view lab/test results, encounter notes, upcoming appointments, etc.  Non-urgent messages can be sent to your provider as well.   To learn more about what you can do with MyChart, please visit --  [email or web address]

## 2020-04-25 NOTE — Progress Notes (Signed)
    Procedures performed today:    None.  Independent interpretation of notes and tests performed by another provider:   None.  Brief History, Exam, Impression, and Recommendations:    Matthew Wiggins is a 35 year old male presenting for follow-up of diabetes.  Accompanied by family member today.  Patient with history of diabetes, most recent labs showed increase in A1c from 6.7% in 2020 to 13.0%.  He had not been taking metformin previously, has resumed with mild GI symptoms, tolerable.  Denies any current polyuria or polydipsia.  BP (!) 158/82   Pulse (!) 118   Ht 6' (1.829 m)   Wt (!) 526 lb 9.6 oz (238.9 kg)   SpO2 98%   BMI 71.42 kg/m   Diabetes (HCC) Chronic, not at goal, hemoglobin A1c 13.0% Discussed lab results with patient and family, discussed concerns related to persistent elevated blood sugars, uncontrolled diabetes and potential for complications if not adequately controlled Discussed treatment recommendations given significantly elevated A1c Continue with gradual titration of metformin to 1000 mg twice daily Will also start on basal insulin, starting at 10 units once daily with gradual titration Patient will come in for nurse visit tomorrow in order to properly instruct on checking blood sugar as well as administering insulin Plan for close follow-up in 1 week to monitor progress, blood sugar log Will also complete foot exam, ophthalmology referral at next office visit as well as repeat blood pressure Discussed need for pneumococcal vaccine at next office visit    ___________________________________________ Shafin Pollio de [place], MD, ABFM, CAQSM Primary Care and Sports Medicine [facility]

## 2020-04-26 ENCOUNTER — Other Ambulatory Visit (HOSPITAL_BASED_OUTPATIENT_CLINIC_OR_DEPARTMENT_OTHER): Payer: Self-pay

## 2020-04-26 ENCOUNTER — Other Ambulatory Visit (HOSPITAL_BASED_OUTPATIENT_CLINIC_OR_DEPARTMENT_OTHER): Payer: Self-pay | Admitting: Family Medicine

## 2020-04-26 ENCOUNTER — Other Ambulatory Visit (HOSPITAL_COMMUNITY): Payer: Self-pay

## 2020-04-26 ENCOUNTER — Ambulatory Visit (INDEPENDENT_AMBULATORY_CARE_PROVIDER_SITE_OTHER): Payer: 59 | Admitting: Family Medicine

## 2020-04-26 VITALS — BP 150/80 | HR 110 | Ht 72.0 in | Wt >= 6400 oz

## 2020-04-26 DIAGNOSIS — D509 Iron deficiency anemia, unspecified: Secondary | ICD-10-CM

## 2020-04-26 DIAGNOSIS — R809 Proteinuria, unspecified: Secondary | ICD-10-CM

## 2020-04-26 DIAGNOSIS — E1129 Type 2 diabetes mellitus with other diabetic kidney complication: Secondary | ICD-10-CM

## 2020-04-26 MED ORDER — INSULIN PEN NEEDLE 31G X 8 MM MISC
3 refills | Status: DC
  Filled 2020-04-26: qty 100, 90d supply, fill #0

## 2020-04-26 MED ORDER — IRON (FERROUS SULFATE) 325 (65 FE) MG PO TABS
325.0000 mg | ORAL_TABLET | Freq: Every day | ORAL | 2 refills | Status: DC
  Filled 2020-04-26: qty 30, fill #0

## 2020-04-26 NOTE — Patient Instructions (Signed)
Insulin injection demo No charge

## 2020-04-27 ENCOUNTER — Encounter (HOSPITAL_BASED_OUTPATIENT_CLINIC_OR_DEPARTMENT_OTHER): Payer: Self-pay

## 2020-04-27 ENCOUNTER — Telehealth (HOSPITAL_BASED_OUTPATIENT_CLINIC_OR_DEPARTMENT_OTHER): Payer: Self-pay

## 2020-04-27 NOTE — Telephone Encounter (Signed)
-----   Message from Hosie Poisson [place], MD sent at 04/26/2020  2:09 PM EDT ----- Labs suggestive of iron deficiency anemia.  Recommend initial treatment with oral iron supplementation.  Will send prescription to the pharmacy.  Avoid taking iron supplement with calcium containing foods and beverages such as milk, calcium supplements, cereal, coffee.  We will plan to recheck hemoglobin after 4 to 6 weeks of iron supplementation.

## 2020-04-27 NOTE — Telephone Encounter (Signed)
Called patient to go over lab results but no answer and patient has no voicemail set up Will review labs results and recommendations with patient at office visit on Monday 05/02/20

## 2020-05-02 ENCOUNTER — Other Ambulatory Visit: Payer: Self-pay

## 2020-05-02 ENCOUNTER — Ambulatory Visit (INDEPENDENT_AMBULATORY_CARE_PROVIDER_SITE_OTHER): Payer: 59 | Admitting: Family Medicine

## 2020-05-02 ENCOUNTER — Other Ambulatory Visit (HOSPITAL_BASED_OUTPATIENT_CLINIC_OR_DEPARTMENT_OTHER): Payer: Self-pay

## 2020-05-02 ENCOUNTER — Encounter (HOSPITAL_BASED_OUTPATIENT_CLINIC_OR_DEPARTMENT_OTHER): Payer: Self-pay | Admitting: Family Medicine

## 2020-05-02 VITALS — BP 134/86 | HR 122 | Ht 72.0 in | Wt >= 6400 oz

## 2020-05-02 DIAGNOSIS — E1129 Type 2 diabetes mellitus with other diabetic kidney complication: Secondary | ICD-10-CM | POA: Diagnosis not present

## 2020-05-02 DIAGNOSIS — R809 Proteinuria, unspecified: Secondary | ICD-10-CM | POA: Diagnosis not present

## 2020-05-02 DIAGNOSIS — E119 Type 2 diabetes mellitus without complications: Secondary | ICD-10-CM | POA: Diagnosis not present

## 2020-05-02 NOTE — Patient Instructions (Addendum)
  Medication Instructions:  Your physician recommends that you continue on your current medications as directed. Please refer to the Current Medication list given to you today. --If you need a refill on any your medications before your next appointment, please call your pharmacy first. If no refills are authorized on file call the office.--  Referrals/Procedures/Imaging: A referral has been placed for you to Ophthalmology for a Diabetic Retinal Screening. Someone from the scheduling department will be in contact with you in regards to coordinating your consultation. If you do not hear from any of the schedulers within 7-10 business days please give our office a call.   Follow-Up: Your next appointment:   Your physician recommends that you schedule a follow-up appointment in: 4 WEEKS with Dr. de Peru  Thanks for letting us be apart of your health journey!!  Primary Care and Sports Medicine   Dr. de Peru and Shawna Clamp, DNP, AGNP  We recommend signing up for the patient portal called "MyChart".  Sign up information is provided on this After Visit Summary.  MyChart is used to connect with patients for Virtual Visits (Telemedicine).  Patients are able to view lab/test results, encounter notes, upcoming appointments, etc.  Non-urgent messages can be sent to your provider as well.   To learn more about what you can do with MyChart, please visit --  [email or web address].

## 2020-05-02 NOTE — Assessment & Plan Note (Addendum)
Chronic, uncontrolled with A1c at 13.0% Continue recording fasting blood sugar numbers Continue with basal insulin titration as instructed Keep appointment with diabetic educator/nutritionist on May 26 Continue with metformin titration with goal of 1000 mg twice daily Diabetic foot exam completed today Referral to ophthalmology for retinopathy screening placed Discussed need for pneumococcal vaccination, patient mother wished to hold off on this for now, continue to reassess and recommend Complications: Moderately increased albuminuria -will repeat testing at 3-6 months

## 2020-05-02 NOTE — Progress Notes (Signed)
    Procedures performed today:    None.  Independent interpretation of notes and tests performed by another provider:   None.  Brief History, Exam, Impression, and Recommendations:    Jerad is a 35 year old male presenting for diabetes follow-up.  Accompanied by his mother.  Diabetes: Started on basal insulin last week.  Provided with titration scale, has titrated to 12 units of Basaglar daily. FBG: 190-230 Oral medications: Metformin, still titrating, taking 1000 mg in the morning and 500 mg in the evening, will increase dose later this week, denies any GI symptoms currently Insulin: Basaglar 12 units daily Has upcoming appointment with diabetic educator/nutritionist at the end of May Does not have an eye doctor, request referral for retinopathy screening Reports that polyuria and polydipsia have improved  BP 134/86   Pulse (!) 122   Ht 6' (1.829 m)   Wt (!) 524 lb 12.8 oz (238 kg)   SpO2 100%   BMI 71.18 kg/m   Foot exam completed today, documented in chart  Diabetes (HCC) Chronic, uncontrolled with A1c at 13.0% Continue recording fasting blood sugar numbers Continue with basal insulin titration as instructed Keep appointment with diabetic educator/nutritionist on May 26 Continue with metformin titration with goal of 1000 mg twice daily Diabetic foot exam completed today Referral to ophthalmology for retinopathy screening placed Discussed need for pneumococcal vaccination, patient mother wished to hold off on this for now, continue to reassess and recommend Complications: Moderately increased albuminuria -will repeat testing at 3-6 months  Spent 30 minutes on this patient encounter, including preparation, chart review, face-to-face counseling with patient and coordination of care, and documentation of encounter   ___________________________________________ Delray Reza de [place], MD, ABFM, CAQSM Primary Care and Sports Medicine [facility]

## 2020-05-11 ENCOUNTER — Ambulatory Visit (HOSPITAL_BASED_OUTPATIENT_CLINIC_OR_DEPARTMENT_OTHER): Payer: 59 | Admitting: Family Medicine

## 2020-05-18 ENCOUNTER — Other Ambulatory Visit (HOSPITAL_BASED_OUTPATIENT_CLINIC_OR_DEPARTMENT_OTHER): Payer: Self-pay

## 2020-05-25 ENCOUNTER — Other Ambulatory Visit (HOSPITAL_BASED_OUTPATIENT_CLINIC_OR_DEPARTMENT_OTHER): Payer: Self-pay

## 2020-05-26 ENCOUNTER — Other Ambulatory Visit: Payer: Self-pay

## 2020-05-26 ENCOUNTER — Encounter: Payer: Self-pay | Admitting: Skilled Nursing Facility1

## 2020-05-26 ENCOUNTER — Encounter: Payer: 59 | Attending: Family Medicine | Admitting: Skilled Nursing Facility1

## 2020-05-26 DIAGNOSIS — R809 Proteinuria, unspecified: Secondary | ICD-10-CM | POA: Diagnosis present

## 2020-05-26 DIAGNOSIS — E1129 Type 2 diabetes mellitus with other diabetic kidney complication: Secondary | ICD-10-CM | POA: Insufficient documentation

## 2020-05-26 NOTE — Progress Notes (Signed)
Diabetes Self-Management Education  Visit Type: First/Initial  05/26/2020  Mr. Matthew Wiggins, identified by name and date of birth, is a 35 y.o. male with a diagnosis of Diabetes: Type 2.   ASSESSMENT  Height 6' (1.829 m), weight (!) 524 lb 11.2 oz (238 kg). Body mass index is 71.16 kg/m.   Pt states he is currently taking 14 units of insulin daily.  Pt states he tests his blood sugars fasting: 115, 165, 148, 154, 130, 177, 144   Goals: Brush your teeth 2 times a day 7 days a week and floss once daily  Check your feet once daily looking for anything that was not there the day before  A podiatrist (foot doctor) is good to have yon your team  Snacks: 1/4 cup nuts + 1 serving fruit (ensure you are not snacking on nuts all throughout the day) Any time there are snacks turn the TV off, you can eat a meal with the TV on (for now :) ) Create those balanced meals with the meal ideas   To discuss next visit: physical activity    Diabetes Self-Management Education - 05/26/20 0819      Visit Information   Visit Type First/Initial      Initial Visit   Diabetes Type Type 2    Are you currently following a meal plan? No    Are you taking your medications as prescribed? Yes      Health Coping   How would you rate your overall health? Good      Psychosocial Assessment   Patient Belief/Attitude about Diabetes Motivated to manage diabetes    Self-care barriers None    Self-management support Family;Friends    Other persons present Family Member    Patient Concerns Nutrition/Meal planning;Healthy Lifestyle    Special Needs None    Preferred Learning Style Visual;Hands on    Learning Readiness Contemplating    How often do you need to have someone help you when you read instructions, pamphlets, or other written materials from your doctor or pharmacy? 1 - Never      Pre-Education Assessment   Patient understands the diabetes disease and treatment process. Needs Instruction     Patient understands incorporating nutritional management into lifestyle. Needs Instruction    Patient undertands incorporating physical activity into lifestyle. Needs Instruction    Patient understands using medications safely. Needs Instruction    Patient understands monitoring blood glucose, interpreting and using results Needs Instruction    Patient understands prevention, detection, and treatment of acute complications. Needs Instruction    Patient understands prevention, detection, and treatment of chronic complications. Needs Instruction    Patient understands how to develop strategies to address psychosocial issues. Needs Instruction    Patient understands how to develop strategies to promote health/change behavior. Needs Instruction      Complications   Last HgB A1C per patient/outside source 13 %    How often do you check your blood sugar? 1-2 times/day    Fasting Blood glucose range (mg/dL) 49-675;916-384    Number of hypoglycemic episodes per month 0    Number of hyperglycemic episodes per week 0    Have you had a dilated eye exam in the past 12 months? No    Have you had a dental exam in the past 12 months? No    Are you checking your feet? Yes    How many days per week are you checking your feet? 1      Dietary Intake  Breakfast 2 boiled eggs + 1 slice of wheat bread    Snack (morning) nuts    Lunch 1 lunchable    Snack (afternoon) nuts    Dinner chicken + salad: sometimes with cheese + lite ranch + frozen veggies    Beverage(s) water, diet soda      Exercise   Exercise Type Light (walking / raking leaves)    How many days per week to you exercise? 3    How many minutes per day do you exercise? 10    Total minutes per week of exercise 30      Patient Education   Previous Diabetes Education No    Disease state  Definition of diabetes, type 1 and 2, and the diagnosis of diabetes;Factors that contribute to the development of diabetes    Acute complications Taught  treatment of hypoglycemia - the 15 rule.      Individualized Goals (developed by patient)   Nutrition Follow meal plan discussed;General guidelines for healthy choices and portions discussed    Physical Activity Exercise 3-5 times per week;30 minutes per day    Medications take my medication as prescribed    Monitoring  test my blood glucose as discussed      Post-Education Assessment   Patient understands the diabetes disease and treatment process. Demonstrates understanding / competency    Patient understands incorporating nutritional management into lifestyle. Demonstrates understanding / competency    Patient undertands incorporating physical activity into lifestyle. Demonstrates understanding / competency    Patient understands using medications safely. Demonstrates understanding / competency    Patient understands monitoring blood glucose, interpreting and using results Demonstrates understanding / competency    Patient understands prevention, detection, and treatment of acute complications. Demonstrates understanding / competency    Patient understands prevention, detection, and treatment of chronic complications. Demonstrates understanding / competency    Patient understands how to develop strategies to address psychosocial issues. Demonstrates understanding / competency    Patient understands how to develop strategies to promote health/change behavior. Demonstrates understanding / competency      Outcomes   Expected Outcomes Demonstrated interest in learning. Expect positive outcomes    Future DMSE 4-6 wks    Program Status Completed           Individualized Plan for Diabetes Self-Management Training:   Learning Objective:  Patient will have a greater understanding of diabetes self-management. Patient education plan is to attend individual and/or group sessions per assessed needs and concerns.   Expected Outcomes:  Demonstrated interest in learning. Expect positive  outcomes  Education material provided: ADA - How to Thrive: A Guide for Your Journey with Diabetes and Meal plan card detailed MyPlate for diabetes   If problems or questions, patient to contact team via:  Phone  Future DSME appointment: 4-6 wks

## 2020-05-27 ENCOUNTER — Ambulatory Visit (INDEPENDENT_AMBULATORY_CARE_PROVIDER_SITE_OTHER): Payer: 59 | Admitting: Family Medicine

## 2020-05-27 ENCOUNTER — Other Ambulatory Visit (HOSPITAL_BASED_OUTPATIENT_CLINIC_OR_DEPARTMENT_OTHER): Payer: Self-pay

## 2020-05-27 VITALS — BP 144/86 | HR 109 | Ht 72.0 in | Wt >= 6400 oz

## 2020-05-27 DIAGNOSIS — R809 Proteinuria, unspecified: Secondary | ICD-10-CM

## 2020-05-27 DIAGNOSIS — I10 Essential (primary) hypertension: Secondary | ICD-10-CM

## 2020-05-27 DIAGNOSIS — Z794 Long term (current) use of insulin: Secondary | ICD-10-CM | POA: Diagnosis not present

## 2020-05-27 DIAGNOSIS — E1129 Type 2 diabetes mellitus with other diabetic kidney complication: Secondary | ICD-10-CM | POA: Diagnosis not present

## 2020-05-27 MED ORDER — METFORMIN HCL 1000 MG PO TABS
1000.0000 mg | ORAL_TABLET | Freq: Two times a day (BID) | ORAL | 1 refills | Status: DC
  Filled 2020-05-27: qty 180, 90d supply, fill #0
  Filled 2020-08-29: qty 180, 90d supply, fill #1

## 2020-05-27 NOTE — Progress Notes (Signed)
    Procedures performed today:    None.  Independent interpretation of notes and tests performed by another provider:   None.  Brief History, Exam, Impression, and Recommendations:    Matthew Wiggins is a 35 yo male presenting for follow-up.  Reports that his fasting blood sugars have been ranging between 130-150.  Currently administering 14 units daily of Basaglar.  Had appointment with nutritionist yesterday, next follow-up with them is in 6 weeks.  Overall feeling well in regards to management of blood sugars.  Reports that he is out of the metformin, finished last dose yesterday.  Has currently been taking 1000 mg twice daily for about 1 week.  Has had some GI upset with this, but doing well generally.  Reports being out of blood pressure medications for about 3 days.  Has been taking lisinopril hydrochlorothiazide combination up until that point.  Needing refill today.  Denies any chest pain, shortness of breath, lightheadedness or dizziness.  BP (!) 144/86   Pulse (!) 109   Ht 6' (1.829 m)   Wt (!) 524 lb 3.2 oz (237.8 kg)   SpO2 98%   BMI 71.09 kg/m   Essential hypertension Blood pressure elevated today, patient without medications for 3 days which is likely contributing to this Check to pharmacy, patient does have refill available Discussed with patient importance of continuing with regular use of medications, if nearing end of prescription, contact pharmacy and if further authorizations are needed they will usually get in touch with [place] to refill these Continue with gradual increase in physical activity, general avoidance of salt in the diet  Diabetes (HCC) Chronic, uncontrolled, most recent hemoglobin A1c 13.0% Fasting blood sugars indicate improving control Given that fasting blood sugars are still generally above 130, can increase insulin to 16 units daily and continue to monitor fasting blood sugars Continue with metformin, refill provided, GI symptoms should gradually  subside with continued use of maximum dose If GI symptoms continue to be problematic, may need to reduce dose of metformin  Plan for follow-up in about 1 month or sooner as needed.  Follow-up on blood sugars at that time and blood pressure.   ___________________________________________ Matthew Calleros de [place], MD, ABFM, [facility] Primary Care and Sports Medicine [facility]

## 2020-05-27 NOTE — Assessment & Plan Note (Signed)
Chronic, uncontrolled, most recent hemoglobin A1c 13.0% Fasting blood sugars indicate improving control Given that fasting blood sugars are still generally above 130, can increase insulin to 16 units daily and continue to monitor fasting blood sugars Continue with metformin, refill provided, GI symptoms should gradually subside with continued use of maximum dose If GI symptoms continue to be problematic, may need to reduce dose of metformin

## 2020-05-27 NOTE — Patient Instructions (Signed)
  Medication Instructions:  Your physician has recommended you make the following change in your medication:  -- INCREASE Insulin to 16 units --If you need a refill on any your medications before your next appointment, please call your pharmacy first. If no refills are authorized on file call the office.--  Follow-Up: Your next appointment:   Your physician recommends that you schedule a follow-up appointment in: 1 MONTH with Dr. de Peru  Thanks for letting us be apart of your health journey!!  Primary Care and Sports Medicine   Dr. de Peru and Shawna Clamp, DNP, AGNP  We recommend signing up for the patient portal called "MyChart".  Sign up information is provided on this After Visit Summary.  MyChart is used to connect with patients for Virtual Visits (Telemedicine).  Patients are able to view lab/test results, encounter notes, upcoming appointments, etc.  Non-urgent messages can be sent to your provider as well.   To learn more about what you can do with MyChart, please visit --  [email or web address].

## 2020-05-27 NOTE — Assessment & Plan Note (Signed)
Blood pressure elevated today, patient without medications for 3 days which is likely contributing to this Check to pharmacy, patient does have refill available Discussed with patient importance of continuing with regular use of medications, if nearing end of prescription, contact pharmacy and if further authorizations are needed they will usually get in touch with [place] to refill these Continue with gradual increase in physical activity, general avoidance of salt in the diet

## 2020-06-22 ENCOUNTER — Other Ambulatory Visit (HOSPITAL_BASED_OUTPATIENT_CLINIC_OR_DEPARTMENT_OTHER): Payer: Self-pay

## 2020-06-28 ENCOUNTER — Other Ambulatory Visit (HOSPITAL_BASED_OUTPATIENT_CLINIC_OR_DEPARTMENT_OTHER): Payer: Self-pay | Admitting: Family Medicine

## 2020-06-28 ENCOUNTER — Other Ambulatory Visit (HOSPITAL_BASED_OUTPATIENT_CLINIC_OR_DEPARTMENT_OTHER): Payer: Self-pay

## 2020-06-28 MED ORDER — FREESTYLE LITE TEST VI STRP
ORAL_STRIP | 0 refills | Status: DC
  Filled 2020-06-28: qty 100, 25d supply, fill #0

## 2020-07-05 ENCOUNTER — Ambulatory Visit (HOSPITAL_BASED_OUTPATIENT_CLINIC_OR_DEPARTMENT_OTHER): Payer: 59 | Admitting: Family Medicine

## 2020-07-06 ENCOUNTER — Ambulatory Visit: Payer: 59 | Admitting: Skilled Nursing Facility1

## 2020-07-07 ENCOUNTER — Encounter (HOSPITAL_BASED_OUTPATIENT_CLINIC_OR_DEPARTMENT_OTHER): Payer: Self-pay | Admitting: Family Medicine

## 2020-07-07 ENCOUNTER — Ambulatory Visit (INDEPENDENT_AMBULATORY_CARE_PROVIDER_SITE_OTHER): Payer: 59 | Admitting: Family Medicine

## 2020-07-07 ENCOUNTER — Other Ambulatory Visit (HOSPITAL_BASED_OUTPATIENT_CLINIC_OR_DEPARTMENT_OTHER): Payer: Self-pay

## 2020-07-07 ENCOUNTER — Other Ambulatory Visit: Payer: Self-pay

## 2020-07-07 VITALS — BP 138/74 | HR 129 | Ht 72.0 in | Wt >= 6400 oz

## 2020-07-07 DIAGNOSIS — R809 Proteinuria, unspecified: Secondary | ICD-10-CM

## 2020-07-07 DIAGNOSIS — I10 Essential (primary) hypertension: Secondary | ICD-10-CM | POA: Diagnosis not present

## 2020-07-07 DIAGNOSIS — Z794 Long term (current) use of insulin: Secondary | ICD-10-CM | POA: Diagnosis not present

## 2020-07-07 DIAGNOSIS — E1129 Type 2 diabetes mellitus with other diabetic kidney complication: Secondary | ICD-10-CM

## 2020-07-07 MED ORDER — LISINOPRIL-HYDROCHLOROTHIAZIDE 10-12.5 MG PO TABS
1.0000 | ORAL_TABLET | Freq: Every day | ORAL | 2 refills | Status: DC
  Filled 2020-07-07 – 2020-07-29 (×2): qty 30, 30d supply, fill #0
  Filled 2020-08-29: qty 30, 30d supply, fill #1
  Filled 2020-09-29: qty 30, 30d supply, fill #2

## 2020-07-07 MED ORDER — BASAGLAR KWIKPEN 100 UNIT/ML ~~LOC~~ SOPN
10.0000 [IU] | PEN_INJECTOR | Freq: Every day | SUBCUTANEOUS | 1 refills | Status: DC
Start: 2020-07-07 — End: 2020-10-13
  Filled 2020-07-07: qty 15, 30d supply, fill #0
  Filled 2020-10-11: qty 15, 30d supply, fill #1

## 2020-07-07 MED ORDER — FREESTYLE LITE TEST VI STRP
ORAL_STRIP | 0 refills | Status: DC
Start: 2020-07-07 — End: 2020-12-22
  Filled 2020-09-02 – 2020-09-07 (×2): qty 100, 25d supply, fill #0

## 2020-07-07 MED ORDER — OZEMPIC (0.25 OR 0.5 MG/DOSE) 2 MG/1.5ML ~~LOC~~ SOPN
PEN_INJECTOR | SUBCUTANEOUS | 0 refills | Status: AC
  Filled 2020-07-07: qty 1.5, 42d supply, fill #0

## 2020-07-07 MED ORDER — INSULIN PEN NEEDLE 31G X 8 MM MISC
3 refills | Status: DC
Start: 2020-07-07 — End: 2021-08-29
  Filled 2020-07-07 – 2020-07-29 (×2): qty 100, 90d supply, fill #0
  Filled 2020-11-10: qty 100, 90d supply, fill #1

## 2020-07-07 NOTE — Progress Notes (Signed)
    Procedures performed today:    None.  Independent interpretation of notes and tests performed by another provider:   None.  Brief History, Exam, Impression, and Recommendations:    BP 138/74   Pulse (!) 129   Ht 6' (1.829 m)   Wt (!) 522 lb (236.8 kg)   SpO2 97%   BMI 70.80 kg/m   Essential hypertension Blood pressure better controlled in office today, however would likely target a goal blood pressure of less than 130/80 Denies any chest pain, shortness of breath, headaches Not checking blood pressure at home Continue with lisinopril and hydrochlorothiazide Continue with lifestyle modifications, DASH diet  Diabetes (HCC) Review of blood sugar log indicates gradual improvement Patient is now taking 18 units of Lantus daily Most recent readings have done in the 120s with occasional readings in the 140s Patient also continues to take metformin as prescribed Discussed with patient today the option of initiating Ozempic for control of blood sugar, weight loss benefit.  Patient would be interested in this, prescription sent to pharmacy for patient to initiate unless cost prohibitive Did caution on potential hypoglycemia with new agent being added.  If fasting blood sugars are less than 80, would decrease Lantus by 2 units If any questions or concerns or developing issues with hypoglycemia, instructed to contact the office for further guidance Has appointment upcoming with nutritionist in about 1 month, encouraged to keep this appointment  Plan for follow-up in about 4 weeks or sooner as needed.  Check A1c and urine microalbumin creatinine ratio at next visit   ___________________________________________ Milfred Krammes de [place], MD, ABFM, CAQSM Primary Care and Sports Medicine [facility]

## 2020-07-07 NOTE — Assessment & Plan Note (Signed)
Review of blood sugar log indicates gradual improvement Patient is now taking 18 units of Lantus daily Most recent readings have done in the 120s with occasional readings in the 140s Patient also continues to take metformin as prescribed Discussed with patient today the option of initiating Ozempic for control of blood sugar, weight loss benefit.  Patient would be interested in this, prescription sent to pharmacy for patient to initiate unless cost prohibitive Did caution on potential hypoglycemia with new agent being added.  If fasting blood sugars are less than 80, would decrease Lantus by 2 units If any questions or concerns or developing issues with hypoglycemia, instructed to contact the office for further guidance Has appointment upcoming with nutritionist in about 1 month, encouraged to keep this appointment

## 2020-07-07 NOTE — Assessment & Plan Note (Signed)
Blood pressure better controlled in office today, however would likely target a goal blood pressure of less than 130/80 Denies any chest pain, shortness of breath, headaches Not checking blood pressure at home Continue with lisinopril and hydrochlorothiazide Continue with lifestyle modifications, DASH diet

## 2020-07-07 NOTE — Patient Instructions (Signed)
  Medication Instructions:  Your physician has recommended you make the following change in your medication:  - -START Ozempic - Inject 0.25 mg once weekly for 4 weeks, then increase to 0.5 mg once weekly two weeks --If you need a refill on any your medications before your next appointment, please call your pharmacy first. If no refills are authorized on file call the office.--  Follow-Up: Your next appointment:   Your physician recommends that you schedule a follow-up appointment in:  4 WEEKS with Dr. de Peru  Thanks for letting us be apart of your health journey!!  Primary Care and Sports Medicine   Dr. Ceasar Mons Peru   We encourage you to activate your patient portal called "MyChart".  Sign up information is provided on this After Visit Summary.  MyChart is used to connect with patients for Virtual Visits (Telemedicine).  Patients are able to view lab/test results, encounter notes, upcoming appointments, etc.  Non-urgent messages can be sent to your provider as well. To learn more about what you can do with MyChart, please visit --  [email or web address].

## 2020-07-08 ENCOUNTER — Telehealth (HOSPITAL_BASED_OUTPATIENT_CLINIC_OR_DEPARTMENT_OTHER): Payer: Self-pay | Admitting: Family Medicine

## 2020-07-08 NOTE — Telephone Encounter (Signed)
Called patient and LVM to let patient know appt on 8/4 has been canceled due to provider being out of the office. Let pt know we can move him in the after noon on the same day if the patient would like. Please advise.

## 2020-07-12 ENCOUNTER — Ambulatory Visit (HOSPITAL_BASED_OUTPATIENT_CLINIC_OR_DEPARTMENT_OTHER): Payer: 59 | Admitting: Family Medicine

## 2020-07-22 ENCOUNTER — Other Ambulatory Visit (HOSPITAL_BASED_OUTPATIENT_CLINIC_OR_DEPARTMENT_OTHER): Payer: Self-pay

## 2020-07-29 ENCOUNTER — Other Ambulatory Visit (HOSPITAL_BASED_OUTPATIENT_CLINIC_OR_DEPARTMENT_OTHER): Payer: Self-pay

## 2020-08-02 ENCOUNTER — Encounter: Payer: 59 | Attending: Family Medicine | Admitting: Skilled Nursing Facility1

## 2020-08-02 DIAGNOSIS — E1129 Type 2 diabetes mellitus with other diabetic kidney complication: Secondary | ICD-10-CM | POA: Insufficient documentation

## 2020-08-02 DIAGNOSIS — R809 Proteinuria, unspecified: Secondary | ICD-10-CM | POA: Insufficient documentation

## 2020-08-04 ENCOUNTER — Ambulatory Visit (HOSPITAL_BASED_OUTPATIENT_CLINIC_OR_DEPARTMENT_OTHER): Payer: 59 | Admitting: Family Medicine

## 2020-08-05 ENCOUNTER — Ambulatory Visit (HOSPITAL_BASED_OUTPATIENT_CLINIC_OR_DEPARTMENT_OTHER): Payer: 59 | Admitting: Family Medicine

## 2020-08-08 ENCOUNTER — Encounter (HOSPITAL_BASED_OUTPATIENT_CLINIC_OR_DEPARTMENT_OTHER): Payer: Self-pay | Admitting: Family Medicine

## 2020-08-29 ENCOUNTER — Other Ambulatory Visit (HOSPITAL_BASED_OUTPATIENT_CLINIC_OR_DEPARTMENT_OTHER): Payer: Self-pay

## 2020-08-29 ENCOUNTER — Other Ambulatory Visit (HOSPITAL_BASED_OUTPATIENT_CLINIC_OR_DEPARTMENT_OTHER): Payer: Self-pay | Admitting: Family Medicine

## 2020-08-29 MED FILL — Lancets: 25 days supply | Qty: 100 | Fill #0 | Status: AC

## 2020-08-31 ENCOUNTER — Other Ambulatory Visit (HOSPITAL_BASED_OUTPATIENT_CLINIC_OR_DEPARTMENT_OTHER): Payer: Self-pay

## 2020-09-02 ENCOUNTER — Other Ambulatory Visit (HOSPITAL_BASED_OUTPATIENT_CLINIC_OR_DEPARTMENT_OTHER): Payer: Self-pay

## 2020-09-07 ENCOUNTER — Other Ambulatory Visit (HOSPITAL_BASED_OUTPATIENT_CLINIC_OR_DEPARTMENT_OTHER): Payer: Self-pay

## 2020-09-29 ENCOUNTER — Other Ambulatory Visit (HOSPITAL_BASED_OUTPATIENT_CLINIC_OR_DEPARTMENT_OTHER): Payer: Self-pay

## 2020-10-03 ENCOUNTER — Other Ambulatory Visit (HOSPITAL_BASED_OUTPATIENT_CLINIC_OR_DEPARTMENT_OTHER): Payer: Self-pay

## 2020-10-12 ENCOUNTER — Other Ambulatory Visit (HOSPITAL_BASED_OUTPATIENT_CLINIC_OR_DEPARTMENT_OTHER): Payer: Self-pay

## 2020-10-13 ENCOUNTER — Other Ambulatory Visit (HOSPITAL_BASED_OUTPATIENT_CLINIC_OR_DEPARTMENT_OTHER): Payer: Self-pay

## 2020-10-13 ENCOUNTER — Other Ambulatory Visit (HOSPITAL_BASED_OUTPATIENT_CLINIC_OR_DEPARTMENT_OTHER): Payer: Self-pay | Admitting: Family Medicine

## 2020-10-13 ENCOUNTER — Ambulatory Visit (HOSPITAL_BASED_OUTPATIENT_CLINIC_OR_DEPARTMENT_OTHER): Payer: 59 | Admitting: Family Medicine

## 2020-10-13 ENCOUNTER — Telehealth (HOSPITAL_BASED_OUTPATIENT_CLINIC_OR_DEPARTMENT_OTHER): Payer: Self-pay | Admitting: Family Medicine

## 2020-10-13 MED ORDER — LANTUS SOLOSTAR 100 UNIT/ML ~~LOC~~ SOPN
15.0000 [IU] | PEN_INJECTOR | Freq: Every day | SUBCUTANEOUS | 2 refills | Status: DC
  Filled 2020-10-13: qty 15, 30d supply, fill #0

## 2020-10-13 MED ORDER — BASAGLAR KWIKPEN 100 UNIT/ML ~~LOC~~ SOPN: 10.0000 [IU] | PEN_INJECTOR | Freq: Every day | SUBCUTANEOUS | 1 refills | Status: DC

## 2020-10-13 NOTE — Telephone Encounter (Signed)
Pt called and needed to resch appt for 10/13 due to an emergency.  Pt mother resch for Monday 10/17. Pt is needing insulin sent in to get him to his new appt on 10/17. Please advise.

## 2020-10-14 ENCOUNTER — Other Ambulatory Visit (HOSPITAL_BASED_OUTPATIENT_CLINIC_OR_DEPARTMENT_OTHER): Payer: Self-pay | Admitting: Family Medicine

## 2020-10-14 ENCOUNTER — Other Ambulatory Visit (HOSPITAL_BASED_OUTPATIENT_CLINIC_OR_DEPARTMENT_OTHER): Payer: Self-pay

## 2020-10-14 MED ORDER — LANTUS SOLOSTAR 100 UNIT/ML ~~LOC~~ SOPN
18.0000 [IU] | PEN_INJECTOR | Freq: Every day | SUBCUTANEOUS | 2 refills | Status: DC
  Filled 2020-10-14: qty 15, 30d supply, fill #0
  Filled 2020-11-15: qty 15, 30d supply, fill #1
  Filled 2020-12-22: qty 30, 60d supply, fill #1

## 2020-10-17 ENCOUNTER — Other Ambulatory Visit (HOSPITAL_BASED_OUTPATIENT_CLINIC_OR_DEPARTMENT_OTHER): Payer: Self-pay

## 2020-10-17 ENCOUNTER — Encounter (HOSPITAL_BASED_OUTPATIENT_CLINIC_OR_DEPARTMENT_OTHER): Payer: Self-pay | Admitting: Family Medicine

## 2020-10-17 ENCOUNTER — Other Ambulatory Visit: Payer: Self-pay

## 2020-10-17 ENCOUNTER — Ambulatory Visit (INDEPENDENT_AMBULATORY_CARE_PROVIDER_SITE_OTHER): Payer: 59 | Admitting: Family Medicine

## 2020-10-17 VITALS — BP 136/86 | HR 132 | Ht 72.0 in | Wt >= 6400 oz

## 2020-10-17 DIAGNOSIS — E119 Type 2 diabetes mellitus without complications: Secondary | ICD-10-CM

## 2020-10-17 DIAGNOSIS — I10 Essential (primary) hypertension: Secondary | ICD-10-CM | POA: Diagnosis not present

## 2020-10-17 DIAGNOSIS — Z794 Long term (current) use of insulin: Secondary | ICD-10-CM | POA: Diagnosis not present

## 2020-10-17 LAB — POCT GLYCOSYLATED HEMOGLOBIN (HGB A1C)
HbA1c POC (<> result, manual entry): 7.3 % (ref 4.0–5.6)
Hemoglobin A1C: 7.3 % — AB (ref 4.0–5.6)

## 2020-10-17 LAB — POCT UA - MICROALBUMIN
Albumin/Creatinine Ratio, Urine, POC: <30
Creatinine, POC: 300 mg/dL
Microalbumin Ur, POC: 30 mg/L

## 2020-10-17 MED ORDER — OZEMPIC (0.25 OR 0.5 MG/DOSE) 2 MG/3ML ~~LOC~~ SOPN
PEN_INJECTOR | SUBCUTANEOUS | 0 refills | Status: AC
  Filled 2020-10-17: qty 1.5, 42d supply, fill #0
  Filled 2021-09-20: qty 3, 42d supply, fill #0

## 2020-10-17 NOTE — Progress Notes (Signed)
    Procedures performed today:    None.  Independent interpretation of notes and tests performed by another provider:   None.  Brief History, Exam, Impression, and Recommendations:    BP 136/86   Pulse (!) 132   Ht 6' (1.829 m)   Wt (!) 524 lb (237.7 kg)   SpO2 98%   BMI 71.07 kg/m   Diabetes (HCC) Indicates fasting blood sugars continue remain around 130s to 140s Currently utilizing metformin and insulin Denies any issues with metformin Continues with Lantus at 18 units/day Did not try to obtain Ozempic after last visit Was supposed to follow-up with nutritionist, however did not keep this appointment, has not rescheduled We will check microalbumin/creatinine ratio as well as A1c today We will attempt to have patient fill Ozempic if not cost prohibitive, advised on potential side effects as well as potential for lower blood sugars and need for close monitoring and titration of Lantus as needed, discussed specifically how to proceed with this Plan for follow-up in 1 month, primarily to assess if tolerating Ozempic if he is able to start this  Essential hypertension Blood pressure stable since last visit, ideally would like his blood pressure less than 130/80 Continues with lisinopril-hydrochlorothiazide combination Denies any issues with chest pain, headaches, lightheadedness or dizziness Does not check regularly at home, encouraged to do so and maintain log Recommend DASH diet  Plan for follow-up in 4 weeks to monitor tolerance of Ozempic   ___________________________________________ Brendaliz Kuk de [place], MD, ABFM, [facility] Primary Care and Sports Medicine [facility]

## 2020-10-17 NOTE — Patient Instructions (Addendum)
  Medication Instructions:  Your physician has recommended you make the following change in your medication:  -- START Ozempic 0.25 mg - Inject once weekly for 4 weeks, then increase to 0.5 mg once weekly for 2 weeks --If you need a refill on any your medications before your next appointment, please call your pharmacy first. If no refills are authorized on file call the office.-- Follow-Up: Your next appointment:   Your physician recommends that you schedule a follow-up appointment in: 4 WEEKS with Dr. de [place]  You will receive a text message or e-mail with a link to a survey about your care and experience with [place] today! We would greatly appreciate your feedback!   Thanks for letting us be apart of your health journey!!  Primary Care and Sports Medicine   Dr. Ceasar Mons [place]   We encourage you to activate your patient portal called "MyChart".  Sign up information is provided on this After Visit Summary.  MyChart is used to connect with patients for Virtual Visits (Telemedicine).  Patients are able to view lab/test results, encounter notes, upcoming appointments, etc.  Non-urgent messages can be sent to your provider as well. To learn more about what you can do with MyChart, please visit --  [email or web address].

## 2020-10-17 NOTE — Assessment & Plan Note (Signed)
Blood pressure stable since last visit, ideally would like his blood pressure less than 130/80 Continues with lisinopril-hydrochlorothiazide combination Denies any issues with chest pain, headaches, lightheadedness or dizziness Does not check regularly at home, encouraged to do so and maintain log Recommend DASH diet

## 2020-10-17 NOTE — Assessment & Plan Note (Signed)
Indicates fasting blood sugars continue remain around 130s to 140s Currently utilizing metformin and insulin Denies any issues with metformin Continues with Lantus at 18 units/day Did not try to obtain Ozempic after last visit Was supposed to follow-up with nutritionist, however did not keep this appointment, has not rescheduled We will check microalbumin/creatinine ratio as well as A1c today We will attempt to have patient fill Ozempic if not cost prohibitive, advised on potential side effects as well as potential for lower blood sugars and need for close monitoring and titration of Lantus as needed, discussed specifically how to proceed with this Plan for follow-up in 1 month, primarily to assess if tolerating Ozempic if he is able to start this

## 2020-11-02 ENCOUNTER — Other Ambulatory Visit (HOSPITAL_BASED_OUTPATIENT_CLINIC_OR_DEPARTMENT_OTHER): Payer: Self-pay

## 2020-11-10 ENCOUNTER — Other Ambulatory Visit (HOSPITAL_BASED_OUTPATIENT_CLINIC_OR_DEPARTMENT_OTHER): Payer: Self-pay | Admitting: Family Medicine

## 2020-11-10 ENCOUNTER — Other Ambulatory Visit (HOSPITAL_BASED_OUTPATIENT_CLINIC_OR_DEPARTMENT_OTHER): Payer: Self-pay

## 2020-11-10 DIAGNOSIS — I10 Essential (primary) hypertension: Secondary | ICD-10-CM

## 2020-11-10 MED ORDER — LISINOPRIL-HYDROCHLOROTHIAZIDE 10-12.5 MG PO TABS
1.0000 | ORAL_TABLET | Freq: Every day | ORAL | 2 refills | Status: DC
  Filled 2020-11-10: qty 30, 30d supply, fill #0
  Filled 2020-12-14: qty 30, 30d supply, fill #1

## 2020-11-15 ENCOUNTER — Other Ambulatory Visit (HOSPITAL_BASED_OUTPATIENT_CLINIC_OR_DEPARTMENT_OTHER): Payer: Self-pay | Admitting: Family Medicine

## 2020-11-15 ENCOUNTER — Other Ambulatory Visit (HOSPITAL_BASED_OUTPATIENT_CLINIC_OR_DEPARTMENT_OTHER): Payer: Self-pay

## 2020-11-15 MED ORDER — METFORMIN HCL 1000 MG PO TABS
1000.0000 mg | ORAL_TABLET | Freq: Two times a day (BID) | ORAL | 1 refills | Status: DC
  Filled 2020-11-15 – 2020-12-14 (×3): qty 180, 90d supply, fill #0

## 2020-11-16 ENCOUNTER — Other Ambulatory Visit (HOSPITAL_BASED_OUTPATIENT_CLINIC_OR_DEPARTMENT_OTHER): Payer: Self-pay

## 2020-11-16 ENCOUNTER — Ambulatory Visit (HOSPITAL_BASED_OUTPATIENT_CLINIC_OR_DEPARTMENT_OTHER): Payer: 59 | Admitting: Family Medicine

## 2020-11-28 ENCOUNTER — Other Ambulatory Visit (HOSPITAL_BASED_OUTPATIENT_CLINIC_OR_DEPARTMENT_OTHER): Payer: Self-pay

## 2020-12-05 ENCOUNTER — Encounter (HOSPITAL_BASED_OUTPATIENT_CLINIC_OR_DEPARTMENT_OTHER): Payer: Self-pay | Admitting: Family Medicine

## 2020-12-14 ENCOUNTER — Other Ambulatory Visit (HOSPITAL_BASED_OUTPATIENT_CLINIC_OR_DEPARTMENT_OTHER): Payer: Self-pay

## 2020-12-22 ENCOUNTER — Other Ambulatory Visit (HOSPITAL_BASED_OUTPATIENT_CLINIC_OR_DEPARTMENT_OTHER): Payer: Self-pay

## 2020-12-22 ENCOUNTER — Other Ambulatory Visit (HOSPITAL_BASED_OUTPATIENT_CLINIC_OR_DEPARTMENT_OTHER): Payer: Self-pay | Admitting: Family Medicine

## 2020-12-22 MED ORDER — FREESTYLE LITE TEST VI STRP
ORAL_STRIP | 5 refills | Status: DC
  Filled 2020-12-22: qty 100, 25d supply, fill #0

## 2020-12-22 MED FILL — Lancets: 25 days supply | Qty: 100 | Fill #1 | Status: AC

## 2021-01-03 ENCOUNTER — Other Ambulatory Visit (HOSPITAL_BASED_OUTPATIENT_CLINIC_OR_DEPARTMENT_OTHER): Payer: Self-pay

## 2021-05-15 ENCOUNTER — Other Ambulatory Visit (HOSPITAL_BASED_OUTPATIENT_CLINIC_OR_DEPARTMENT_OTHER): Payer: Self-pay

## 2021-08-29 ENCOUNTER — Encounter (HOSPITAL_BASED_OUTPATIENT_CLINIC_OR_DEPARTMENT_OTHER): Payer: Self-pay | Admitting: Family Medicine

## 2021-08-29 ENCOUNTER — Ambulatory Visit (INDEPENDENT_AMBULATORY_CARE_PROVIDER_SITE_OTHER): Payer: 59 | Admitting: Family Medicine

## 2021-08-29 ENCOUNTER — Other Ambulatory Visit (HOSPITAL_BASED_OUTPATIENT_CLINIC_OR_DEPARTMENT_OTHER): Payer: Self-pay

## 2021-08-29 DIAGNOSIS — I10 Essential (primary) hypertension: Secondary | ICD-10-CM

## 2021-08-29 DIAGNOSIS — R220 Localized swelling, mass and lump, head: Secondary | ICD-10-CM | POA: Insufficient documentation

## 2021-08-29 DIAGNOSIS — E1165 Type 2 diabetes mellitus with hyperglycemia: Secondary | ICD-10-CM | POA: Diagnosis not present

## 2021-08-29 DIAGNOSIS — Z794 Long term (current) use of insulin: Secondary | ICD-10-CM | POA: Diagnosis not present

## 2021-08-29 HISTORY — DX: Localized swelling, mass and lump, head: R22.0

## 2021-08-29 MED ORDER — METFORMIN HCL 500 MG PO TABS
500.0000 mg | ORAL_TABLET | Freq: Two times a day (BID) | ORAL | 1 refills | Status: DC
  Filled 2021-08-29: qty 180, 90d supply, fill #0
  Filled 2021-09-20: qty 60, 30d supply, fill #0

## 2021-08-29 MED ORDER — LISINOPRIL-HYDROCHLOROTHIAZIDE 10-12.5 MG PO TABS
1.0000 | ORAL_TABLET | Freq: Every day | ORAL | 1 refills | Status: DC
  Filled 2021-08-29: qty 90, 90d supply, fill #0
  Filled 2021-09-20 – 2022-03-30 (×2): qty 30, 30d supply, fill #0

## 2021-08-29 MED ORDER — ONETOUCH ULTRA 2 W/DEVICE KIT
PACK | 0 refills | Status: DC
  Filled 2021-08-29 – 2021-09-20 (×2): qty 1, 30d supply, fill #0

## 2021-08-29 MED ORDER — ONETOUCH DELICA PLUS LANCET30G MISC
11 refills | Status: DC
  Filled 2021-08-29 – 2021-09-20 (×2): qty 100, 25d supply, fill #0

## 2021-08-29 MED ORDER — INSULIN PEN NEEDLE 31G X 8 MM MISC
3 refills | Status: DC
  Filled 2021-08-29: qty 100, 90d supply, fill #0
  Filled 2021-09-20: qty 100, 30d supply, fill #0

## 2021-08-29 MED ORDER — LANTUS SOLOSTAR 100 UNIT/ML ~~LOC~~ SOPN
10.0000 [IU] | PEN_INJECTOR | Freq: Every day | SUBCUTANEOUS | 2 refills | Status: DC
  Filled 2021-08-29: qty 9, 90d supply, fill #0
  Filled 2021-09-20: qty 15, 150d supply, fill #0

## 2021-08-29 MED ORDER — GLUCOSE BLOOD VI STRP
ORAL_STRIP | 5 refills | Status: DC
  Filled 2021-08-29 – 2021-09-20 (×3): qty 100, 25d supply, fill #0

## 2021-08-29 NOTE — Assessment & Plan Note (Signed)
Patient has been out of all of his medications.  Previously he was taking metformin and administering Lantus daily, was utilizing 18 units daily.  At last office visit almost 1 year ago, we are also looking to start patient on GLP-1 receptor agonist.  No progress was made in that regard.  He has been without his diabetes medications for some time now.  Has not had recent hemoglobin A1c checked.  Does not have testing supplies at home currently, has not been checking blood sugars. Today, we will check labs including hemoglobin A1c and urine microalbumin/creatinine ratio We will have patient resume diabetes medications including metformin and Lantus.  We will have him start on low-dose of each with gradual titration as tolerated.  We will start initially with metformin 500 mg twice daily.  We will begin Lantus at 10 units daily and titrate dose according to fasting blood sugar averages.  I have also sent testing supplies to pharmacy so that he may check his fasting blood sugars daily. We will plan for close follow-up in about 4 to 6 weeks to assess progress Did discuss that if A1c is notably elevated, would plan to send referral to endocrinology for additional assistance in better controlling his blood sugars

## 2021-08-29 NOTE — Assessment & Plan Note (Signed)
Patient or mother do indicate that he had some mild swelling yesterday, this was just below mouth along the right side.  Did not have any respiratory compromise, no shortness of breath at that time.  Did not have any associated pain.  No dental pain, no problems with eating or drinking.  Still with some swelling today, however reports that it has improved this morning as compared to yesterday.  No specific interventions tried to help with swelling In the office today, he is in no acute distress, vital signs are stable, afebrile.  On exam, mild swelling just to the right of chin, no tenderness to palpation, no fluctuance, no edema.  He does not have any tenderness to palpation along mandible or in the submandibular area.  Intraorally, no obvious source of swelling, no discharge or tooth pain. Uncertain cause for swelling.  Given that it has improved since first noticed yesterday, can continue with monitoring.  Discussed that there is not any obvious source of infection at this time, intraorally things appear normal, no erythema or warmth through soft tissues, no fluctuance noted. If any worsening does occur, recommend returning to the office or presenting to emergency department for further evaluation

## 2021-08-29 NOTE — Patient Instructions (Signed)
  Medication Instructions:  Your physician recommends that you continue on your current medications as directed. Please refer to the Current Medication list given to you today. --If you need a refill on any your medications before your next appointment, please call your pharmacy first. If no refills are authorized on file call the office.-- Lab Work: Your physician has recommended that you have lab work today: Yes If you have labs (blood work) drawn today and your tests are completely normal, you will receive your results via MyChart message OR a phone call from our staff.  Please ensure you check your voicemail in the event that you authorized detailed messages to be left on a delegated number. If you have any lab test that is abnormal or we need to change your treatment, we will call you to review the results.  Referrals/Procedures/Imaging: No  Follow-Up: Your next appointment:   Your physician recommends that you schedule a follow-up appointment in: 4-6 weeks with Dr. de [place].  You will receive a text message or e-mail with a link to a survey about your care and experience with us today! We would greatly appreciate your feedback!   Thanks for letting us be apart of your health journey!!  Primary Care and Sports Medicine   Dr. Raymond de [place]   We encourage you to activate your patient portal called "MyChart".  Sign up information is provided on this After Visit Summary.  MyChart is used to connect with patients for Virtual Visits (Telemedicine).  Patients are able to view lab/test results, encounter notes, upcoming appointments, etc.  Non-urgent messages can be sent to your provider as well. To learn more about what you can do with MyChart, please visit --  [email or web address].    

## 2021-08-29 NOTE — Assessment & Plan Note (Signed)
Blood pressure today with elevated diastolic reading.  He has not been taking his antihypertensive, has been out of medication for some time now.  Not checking blood pressure at home. Recommend resuming antihypertensive, prescription sent to pharmacy on file Recommend intermittent monitoring of blood pressure at home, DASH diet

## 2021-08-29 NOTE — Progress Notes (Signed)
Procedures performed today:    None.  Independent interpretation of notes and tests performed by another provider:   None.  Brief History, Exam, Impression, and Recommendations:    BP (!) 117/108   Pulse (!) 129   Ht 6' (1.829 m)   Wt (!) 510 lb 1.6 oz (231.4 kg)   SpO2 100%   BMI 69.18 kg/m   Patient last seen in October 2022, was supposed to follow-up about 1 month after last office visit, however he has been lost to follow-up since that time.  Essential hypertension Blood pressure today with elevated diastolic reading.  He has not been taking his antihypertensive, has been out of medication for some time now.  Not checking blood pressure at home. Recommend resuming antihypertensive, prescription sent to pharmacy on file Recommend intermittent monitoring of blood pressure at home, DASH diet  Diabetes mellitus (HCC) Patient has been out of all of his medications.  Previously he was taking metformin and administering Lantus daily, was utilizing 18 units daily.  At last office visit almost 1 year ago, we are also looking to start patient on GLP-1 receptor agonist.  No progress was made in that regard.  He has been without his diabetes medications for some time now.  Has not had recent hemoglobin A1c checked.  Does not have testing supplies at home currently, has not been checking blood sugars. Today, we will check labs including hemoglobin A1c and urine microalbumin/creatinine ratio We will have patient resume diabetes medications including metformin and Lantus.  We will have him start on low-dose of each with gradual titration as tolerated.  We will start initially with metformin 500 mg twice daily.  We will begin Lantus at 10 units daily and titrate dose according to fasting blood sugar averages.  I have also sent testing supplies to pharmacy so that he may check his fasting blood sugars daily. We will plan for close follow-up in about 4 to 6 weeks to assess progress Did discuss  that if A1c is notably elevated, would plan to send referral to endocrinology for additional assistance in better controlling his blood sugars  Mouth swelling Patient or mother do indicate that he had some mild swelling yesterday, this was just below mouth along the right side.  Did not have any respiratory compromise, no shortness of breath at that time.  Did not have any associated pain.  No dental pain, no problems with eating or drinking.  Still with some swelling today, however reports that it has improved this morning as compared to yesterday.  No specific interventions tried to help with swelling In the office today, he is in no acute distress, vital signs are stable, afebrile.  On exam, mild swelling just to the right of chin, no tenderness to palpation, no fluctuance, no edema.  He does not have any tenderness to palpation along mandible or in the submandibular area.  Intraorally, no obvious source of swelling, no discharge or tooth pain. Uncertain cause for swelling.  Given that it has improved since first noticed yesterday, can continue with monitoring.  Discussed that there is not any obvious source of infection at this time, intraorally things appear normal, no erythema or warmth through soft tissues, no fluctuance noted. If any worsening does occur, recommend returning to the office or presenting to emergency department for further evaluation  Return in about 6 weeks (around 10/10/2021) for DM, HTN.   ___________________________________________ Shirlena Brinegar de [place], MD, ABFM, [facility] Primary Care and Sports Medicine [facility]  [facility]  Hemoglobin A1c notably elevated at 11.2%.  We will place referral to endocrinology for further assistance in addressing diabetes.

## 2021-08-31 LAB — MICROALBUMIN / CREATININE URINE RATIO
Creatinine, Urine: 243.7 mg/dL
Microalb/Creat Ratio: 11 mg/g creat (ref 0–29)
Microalbumin, Urine: 26.8 ug/mL

## 2021-08-31 LAB — HEMOGLOBIN A1C
Est. average glucose Bld gHb Est-mCnc: 275 mg/dL
Hgb A1c MFr Bld: 11.2 % — ABNORMAL HIGH (ref 4.8–5.6)

## 2021-08-31 NOTE — Addendum Note (Signed)
Addended by: DE Peru, Xeng Kucher J on: 08/31/2021 06:13 PM   Modules accepted: Orders

## 2021-09-14 ENCOUNTER — Other Ambulatory Visit (HOSPITAL_BASED_OUTPATIENT_CLINIC_OR_DEPARTMENT_OTHER): Payer: Self-pay

## 2021-09-20 ENCOUNTER — Other Ambulatory Visit (HOSPITAL_BASED_OUTPATIENT_CLINIC_OR_DEPARTMENT_OTHER): Payer: Self-pay | Admitting: Family Medicine

## 2021-09-20 ENCOUNTER — Other Ambulatory Visit (HOSPITAL_BASED_OUTPATIENT_CLINIC_OR_DEPARTMENT_OTHER): Payer: Self-pay

## 2021-09-20 DIAGNOSIS — E1165 Type 2 diabetes mellitus with hyperglycemia: Secondary | ICD-10-CM

## 2021-09-20 MED ORDER — BASAGLAR KWIKPEN 100 UNIT/ML ~~LOC~~ SOPN
10.0000 [IU] | PEN_INJECTOR | Freq: Every day | SUBCUTANEOUS | 2 refills | Status: DC
  Filled 2021-09-20: qty 3, 30d supply, fill #0

## 2021-09-21 ENCOUNTER — Other Ambulatory Visit (HOSPITAL_BASED_OUTPATIENT_CLINIC_OR_DEPARTMENT_OTHER): Payer: Self-pay

## 2021-09-28 ENCOUNTER — Other Ambulatory Visit (HOSPITAL_BASED_OUTPATIENT_CLINIC_OR_DEPARTMENT_OTHER): Payer: Self-pay

## 2022-03-30 ENCOUNTER — Other Ambulatory Visit (HOSPITAL_BASED_OUTPATIENT_CLINIC_OR_DEPARTMENT_OTHER): Payer: Self-pay

## 2022-04-23 ENCOUNTER — Telehealth: Payer: Self-pay | Admitting: Pharmacist

## 2022-04-23 NOTE — Progress Notes (Signed)
Patient attempted to be outreached by Natalie Elchert, PharmD Candidate on 04/23/2022 to discuss hypertension. Left voicemail for patient to return our call at their convenience at [phone].   Natalie Elchert, PharmD Candidate   Catie T. Margaree Sandhu, PharmD, BCACP, CPP [facility] [phone]  

## 2022-06-07 ENCOUNTER — Telehealth: Payer: Self-pay | Admitting: Family Medicine

## 2022-06-07 NOTE — Telephone Encounter (Signed)
error 

## 2022-08-28 ENCOUNTER — Encounter: Payer: Self-pay | Admitting: Nurse Practitioner

## 2022-08-28 ENCOUNTER — Ambulatory Visit (INDEPENDENT_AMBULATORY_CARE_PROVIDER_SITE_OTHER): Payer: Commercial Managed Care - HMO | Admitting: Nurse Practitioner

## 2022-08-28 ENCOUNTER — Other Ambulatory Visit (HOSPITAL_BASED_OUTPATIENT_CLINIC_OR_DEPARTMENT_OTHER): Payer: Self-pay

## 2022-08-28 DIAGNOSIS — E559 Vitamin D deficiency, unspecified: Secondary | ICD-10-CM

## 2022-08-28 DIAGNOSIS — Z0000 Encounter for general adult medical examination without abnormal findings: Secondary | ICD-10-CM

## 2022-08-28 DIAGNOSIS — B372 Candidiasis of skin and nail: Secondary | ICD-10-CM

## 2022-08-28 DIAGNOSIS — I152 Hypertension secondary to endocrine disorders: Secondary | ICD-10-CM

## 2022-08-28 DIAGNOSIS — E1159 Type 2 diabetes mellitus with other circulatory complications: Secondary | ICD-10-CM

## 2022-08-28 DIAGNOSIS — Z560 Unemployment, unspecified: Secondary | ICD-10-CM | POA: Diagnosis not present

## 2022-08-28 DIAGNOSIS — Z794 Long term (current) use of insulin: Secondary | ICD-10-CM

## 2022-08-28 DIAGNOSIS — E1169 Type 2 diabetes mellitus with other specified complication: Secondary | ICD-10-CM

## 2022-08-28 MED ORDER — EDARBI 40 MG PO TABS: ORAL_TABLET | ORAL | Status: DC

## 2022-08-28 MED ORDER — FLUCONAZOLE 150 MG PO TABS
150.0000 mg | ORAL_TABLET | ORAL | 1 refills | Status: AC
  Filled 2022-08-28: qty 4, 12d supply, fill #0

## 2022-08-28 MED ORDER — INSULIN DETEMIR 100 UNIT/ML ~~LOC~~ SOLN: SUBCUTANEOUS | 0 refills | Status: DC

## 2022-08-28 NOTE — Progress Notes (Signed)
Tollie Eth, DNP, AGNP-c [facility] [address] [place], [place] [address] Main Office [phone]   New patient visit   Patient: Matthew Wiggins   DOB: [birth year]   37 y.o. Male  MRN: [id number] Visit Date: 08/28/2022  Patient Care Team: Tollie Eth, NP as PCP - General (Nurse Practitioner)  Today's Vitals   08/28/22 1404  BP: (!) 120/100  Pulse: 99  SpO2: 94%  Weight: (!) 502 lb (227.7 kg)  Height: 6' (1.829 m)   Body mass index is 68.08 kg/m.   Today's healthcare provider: Tollie Eth, NP   Chief Complaint  Patient presents with   Annual Exam    Weight loss.    Subjective    Matthew Wiggins is a 37 y.o. male who presents today as a new patient to establish care.    Patient endorses the following concerns presently: Physical Exam today with discussion of chronic health concerns.   History reviewed and reveals the following: Past Medical History:  Diagnosis Date   Diabetes mellitus without complication (HCC)    Hypertension    Mouth swelling 08/29/2021   History reviewed. No pertinent surgical history. Family Status  Relation Name Status   Mother  Alive   Father  Deceased   MGM  (Not Specified)  No partnership data on file   Family History  Problem Relation Age of Onset   Diabetes Maternal Grandmother    Hypertension Maternal Grandmother    Social History   Socioeconomic History   Marital status: Single    Spouse name: Not on file   Number of children: Not on file   Years of education: Not on file   Highest education level: Not on file  Occupational History   Occupation: unemployed  Tobacco Use   Smoking status: Never   Smokeless tobacco: Never  Vaping Use   Vaping status: Never Used  Substance and Sexual Activity   Alcohol use: No   Drug use: No   Sexual activity: Not on file  Other Topics Concern   Not on file  Social History Narrative   Not on file   Social Determinants of Health    Financial Resource Strain: High Risk (08/28/2022)   Overall Financial Resource Strain (CARDIA)    Difficulty of Paying Living Expenses: Very hard  Food Insecurity: No Food Insecurity (08/28/2022)   Hunger Vital Sign    Worried About Running Out of Food in the Last Year: Never true    Ran Out of Food in the Last Year: Never true  Transportation Needs: Unmet Transportation Needs (08/28/2022)   PRAPARE - Transportation    Lack of Transportation (Medical): No    Lack of Transportation (Non-Medical): Yes  Physical Activity: Inactive (08/28/2022)   Exercise Vital Sign    Days of Exercise per Week: 0 days    Minutes of Exercise per Session: 0 min  Stress: No Stress Concern Present (08/28/2022)   [employer] of Occupational Health - Occupational Stress Questionnaire    Feeling of Stress : Only a little  Social Connections: Socially Isolated (08/28/2022)   Social Connection and Isolation Panel [NHANES]    Frequency of Communication with Friends and Family: Never    Frequency of Social Gatherings with Friends and Family: Once a week    Attends Religious Services: Never    Database administrator or Organizations: No    Attends Banker Meetings: Never    Marital Status: Never married  Outpatient Medications Prior to Visit  Medication Sig   [DISCONTINUED] Blood Glucose Monitoring Suppl (ONE TOUCH ULTRA 2) w/Device KIT Use as directed 4 times daily. (Patient not taking: Reported on 08/28/2022)   [DISCONTINUED] glucose blood test strip Use as directed 4 times daily. (Patient not taking: Reported on 08/28/2022)   [DISCONTINUED] Insulin Glargine (BASAGLAR KWIKPEN) 100 UNIT/ML Inject 10 Units into the skin daily. (Patient not taking: Reported on 08/28/2022)   [DISCONTINUED] Insulin Pen Needle 31G X 8 MM MISC Use as directed daily with Lantus. (Patient not taking: Reported on 08/28/2022)   [DISCONTINUED] Iron, Ferrous Sulfate, 325 (65 Fe) MG TABS Take 325 mg by mouth daily. (Patient not  taking: Reported on 08/28/2022)   [DISCONTINUED] Lancets (ONETOUCH DELICA PLUS LANCET30G) MISC Use as directed 4 times daily. (Patient not taking: Reported on 08/28/2022)   [DISCONTINUED] lisinopril-hydrochlorothiazide (ZESTORETIC) 10-12.5 MG tablet Take 1 tablet by mouth daily. (Patient not taking: Reported on 08/28/2022)   [DISCONTINUED] metFORMIN (GLUCOPHAGE) 500 MG tablet Take 1 tablet (500 mg total) by mouth 2 (two) times daily with a meal. (Patient not taking: Reported on 08/28/2022)   [DISCONTINUED] TRUEplus Lancets 28G MISC Use as directed (Patient not taking: Reported on 08/28/2022)   No facility-administered medications prior to visit.   Allergies  Allergen Reactions   Amoxicillin    Immunization History  Administered Date(s) Administered   Influenza Whole 11/20/2018   Influenza,inj,Quad PF,6+ Mos 08/22/2017   Tdap 08/22/2017    Health Maintenance Due Health Maintenance Topics with due status: Overdue     Topic Date Due   OPHTHALMOLOGY EXAM Never done   HIV Screening Never done   Hepatitis C Screening Never done   FOOT EXAM 05/27/2021   COVID-19 Vaccine Never done   Diabetic kidney evaluation - Urine ACR 08/30/2022    Review of Systems All review of systems negative except what is listed in the HPI   Objective    BP (!) 120/100   Pulse 99   Ht 6' (1.829 m)   Wt (!) 502 lb (227.7 kg) Comment: pt heavier than scale  SpO2 94%   BMI 68.08 kg/m  Physical Exam Vitals and nursing note reviewed.  Constitutional:      Appearance: Normal appearance. He is obese.  HENT:     Head: Normocephalic and atraumatic.     Right Ear: Hearing and tympanic membrane normal.     Left Ear: Hearing and tympanic membrane normal.     Nose: Nose normal.     Right Sinus: No maxillary sinus tenderness or frontal sinus tenderness.     Left Sinus: No maxillary sinus tenderness or frontal sinus tenderness.     Mouth/Throat:     Lips: Pink.     Mouth: Mucous membranes are moist.      Pharynx: Oropharynx is clear.  Eyes:     General: Lids are normal. Vision grossly intact.     Pupils: Pupils are equal, round, and reactive to light.     Funduscopic exam:    Right eye: No hemorrhage. Red reflex present.        Left eye: No hemorrhage. Red reflex present.    Visual Fields: Right eye visual fields normal and left eye visual fields normal.  Neck:     Thyroid: No thyromegaly.     Vascular: No JVD.  Cardiovascular:     Rate and Rhythm: Normal rate and regular rhythm.     Chest Wall: PMI is not displaced.     Pulses: Normal  pulses.          Dorsalis pedis pulses are 2+ on the right side and 2+ on the left side.       Posterior tibial pulses are 2+ on the right side and 2+ on the left side.     Heart sounds: Normal heart sounds. No murmur heard. Pulmonary:     Effort: Pulmonary effort is normal. No respiratory distress.     Breath sounds: Normal breath sounds.  Chest:  Breasts:    Breasts are symmetrical.  Abdominal:     General: Bowel sounds are normal. There is no distension or abdominal bruit.     Palpations: Abdomen is soft. There is no hepatomegaly or splenomegaly.     Tenderness: There is no right CVA tenderness or left CVA tenderness.  Musculoskeletal:        General: Normal range of motion.     Cervical back: Full passive range of motion without pain and normal range of motion. No spinous process tenderness or muscular tenderness.     Right lower leg: Edema present.     Left lower leg: Edema present.  Feet:     Right foot:     Toenail Condition: Right toenails are normal.     Left foot:     Toenail Condition: Left toenails are normal.  Lymphadenopathy:     Upper Body:     Right upper body: No supraclavicular adenopathy.     Left upper body: No supraclavicular adenopathy.  Skin:    General: Skin is warm and dry.     Capillary Refill: Capillary refill takes less than 2 seconds.     Findings: Rash present.     Nails: There is no clubbing.     Comments:  Erythematous rash noted bilaterally under arms and around panus.   Neurological:     General: No focal deficit present.     Mental Status: He is alert and oriented to person, place, and time.     Sensory: Sensory deficit present.     Motor: Motor function is intact.     Coordination: Coordination is intact.     Gait: Gait abnormal.  Psychiatric:        Attention and Perception: Attention normal.        Mood and Affect: Mood normal.        Speech: Speech normal.        Behavior: Behavior normal. Behavior is cooperative.        Cognition and Memory: Cognition and memory normal.     Results for orders placed or performed in visit on 08/28/22  Hemoglobin A1c  Result Value Ref Range   Hgb A1c MFr Bld 12.4 (H) 4.8 - 5.6 %   Est. average glucose Bld gHb Est-mCnc 309 mg/dL  CBC with Differential/Platelet  Result Value Ref Range   WBC 6.8 3.4 - 10.8 x10E3/uL   RBC 6.19 (H) 4.14 - 5.80 x10E6/uL   Hemoglobin 13.6 13.0 - 17.7 g/dL   Hematocrit 16.1 09.6 - 51.0 %   MCV 73 (L) 79 - 97 fL   MCH 22.0 (L) 26.6 - 33.0 pg   MCHC 30.0 (L) 31.5 - 35.7 g/dL   RDW 04.5 (H) 40.9 - 81.1 %   Platelets 406 150 - 450 x10E3/uL   Neutrophils 59 Not Estab. %   Lymphs 28 Not Estab. %   Monocytes 10 Not Estab. %   Eos 2 Not Estab. %   Basos 1 Not Estab. %  Neutrophils Absolute 4.0 1.4 - 7.0 x10E3/uL   Lymphocytes Absolute 1.9 0.7 - 3.1 x10E3/uL   Monocytes Absolute 0.7 0.1 - 0.9 x10E3/uL   EOS (ABSOLUTE) 0.1 0.0 - 0.4 x10E3/uL   Basophils Absolute 0.1 0.0 - 0.2 x10E3/uL   Immature Granulocytes 0 Not Estab. %   Immature Grans (Abs) 0.0 0.0 - 0.1 x10E3/uL  Comprehensive metabolic panel  Result Value Ref Range   Glucose 301 (H) 70 - 99 mg/dL   BUN 9 6 - 20 mg/dL   Creatinine, Ser 6.04 0.76 - 1.27 mg/dL   eGFR 540 >98 JX/BJY/7.82   BUN/Creatinine Ratio 12 9 - 20   Sodium 135 134 - 144 mmol/L   Potassium 4.8 3.5 - 5.2 mmol/L   Chloride 97 96 - 106 mmol/L   CO2 24 20 - 29 mmol/L   Calcium 9.4 8.7 -  10.2 mg/dL   Total Protein 7.8 6.0 - 8.5 g/dL   Albumin 4.0 (L) 4.1 - 5.1 g/dL   Globulin, Total 3.8 1.5 - 4.5 g/dL   Bilirubin Total 0.3 0.0 - 1.2 mg/dL   Alkaline Phosphatase 128 (H) 44 - 121 IU/L   AST 12 0 - 40 IU/L   ALT 14 0 - 44 IU/L  Lipid panel  Result Value Ref Range   Cholesterol, Total 164 100 - 199 mg/dL   Triglycerides 82 0 - 149 mg/dL   HDL 36 (L) >95 mg/dL   VLDL Cholesterol Cal 16 5 - 40 mg/dL   LDL Chol Calc (NIH) 621 (H) 0 - 99 mg/dL   Chol/HDL Ratio 4.6 0.0 - 5.0 ratio  TSH  Result Value Ref Range   TSH 2.150 0.450 - 4.500 uIU/mL  VITAMIN D 25 Hydroxy (Vit-D Deficiency, Fractures)  Result Value Ref Range   Vit D, 25-Hydroxy 5.8 (L) 30.0 - 100.0 ng/mL    Assessment & Plan      Problem List Items Addressed This Visit     Morbid obesity (HCC) - Primary    Chronic. Will obtain labs today for evaluation. We will take this journey one step at a time. I feel that with improved blood sugar and dietary measures we will be able to help him achieve a healthy weight.       Relevant Medications   insulin detemir (LEVEMIR) 100 UNIT/ML injection   Other Relevant Orders   Ambulatory referral to Social Work   Hemoglobin A1c (Completed)   CBC with Differential/Platelet (Completed)   Comprehensive metabolic panel (Completed)   Lipid panel (Completed)   TSH (Completed)   VITAMIN D 25 Hydroxy (Vit-D Deficiency, Fractures) (Completed)   Hypertension associated with diabetes (HCC)    Chronic. BP is not controlled today. He is not currently on his medications, which explains this. Will obtain labs and plan to restart medications. Samples provided today to get him started.       Relevant Medications   insulin detemir (LEVEMIR) 100 UNIT/ML injection   Azilsartan Medoxomil (EDARBI) 40 MG TABS   Other Relevant Orders   Ambulatory referral to Social Work   Hemoglobin A1c (Completed)   CBC with Differential/Platelet (Completed)   Comprehensive metabolic panel (Completed)    Lipid panel (Completed)   TSH (Completed)   VITAMIN D 25 Hydroxy (Vit-D Deficiency, Fractures) (Completed)   Type 2 diabetes mellitus with other specified complication (HCC)    Chronic. Not currently on medication at this time, susupect this is not well controlled. Plan to obtain labs and restart medications. Samples provided today for immediate  use. Depending on insurance coverage specifics, we may need to make changes.       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 Will send treatment with oral and topical medications. I am hopeful with improved control of blood sugars and weight management this will improve.       Relevant Medications   fluconazole (DIFLUCAN) 150 MG tablet   Encounter for annual physical exam    CPE completed today. Review of HM activities and recommendations discussed and provided on AVS. Anticipatory guidance, diet, and exercise recommendations provided. Medications, allergies, and hx reviewed and updated as necessary. Orders placed as listed below.  Plan: - Labs ordered. Will make changes as necessary based on results.  - I will review these results and send recommendations via MyChart or a telephone call.  - F/U with CPE in 1 year or sooner for acute/chronic health needs as directed.        Other Visit Diagnoses     Unemployed       Relevant Orders   Ambulatory referral to Social Work        Return in about 6 weeks (around 10/09/2022) for Med Management 45.      Kynsli Haapala, Sung Amabile, NP, DNP, AGNP-C [facility]

## 2022-08-28 NOTE — Patient Instructions (Addendum)
WEIGHT LOSS PLANNING  For best management of weight, it is vital to balance intake versus output. This means the number of calories burned per day must be less than the calories you take in with food and drink.   I recommend trying to follow a diet with the following: Calories: 1200-1500 calories per day Carbohydrates: 150-180 grams of carbohydrates per day  Why: Gives your body enough "quick fuel" for cells to maintain normal function without sending them into starvation mode.  Protein: At least 90 grams of protein per day- 30 grams with each meal Why: Protein takes longer and uses more energy than carbohydrates to break down for fuel. The carbohydrates in your meals serves as quick energy sources and proteins help use some of that extra quick energy to break down to produce long term energy. This helps you not feel hungry as quickly and protein breakdown burns calories.  Water: Drink AT LEAST 64 ounces of water per day  Why: Water is essential to healthy metabolism. Water helps to fill the stomach and keep you fuller longer. Water is required for healthy digestion and filtering of waste in the body.  Fat: Limit fats in your diet- when choosing fats, choose foods with lower fats content such as lean meats (chicken, fish, turkey).  Why: Increased fat intake leads to storage "for later". Once you burn your carbohydrate energy, your body goes into fat and protein breakdown mode to help you loose weight.  Cholesterol: Fats and oils that are LIQUID at room temperature are best. Choose vegetable oils (olive oil, avocado oil, nuts). Avoid fats that are SOLID at room temperature (animal fats, processed meats). Healthy fats are often found in whole grains, beans, nuts, seeds, and berries.  Why: Elevated cholesterol levels lead to build up of cholesterol on the inside of your blood vessels. This will eventually cause the blood vessels to become hard and can lead to high blood pressure and damage to your  organs. When the blood flow is reduced, but the pressure is high from cholesterol buildup, parts of the cholesterol can break off and form clots that can go to the brain or heart leading to a stroke or heart attack.  Fiber: Increase amount of SOLUBLE the fiber in your diet. This helps to fill you up, lowers cholesterol, and helps with digestion. Some foods high in soluble fiber are oats, peas, beans, apples, carrots, barley, and citrus fruits.   Why: Fiber fills you up, helps remove excess cholesterol, and aids in healthy digestion which are all very important in weight management.   I recommend the following as a minimum activity routine: Purposeful walk or other physical activity at least 20 minutes every single day. This means purposefully taking a walk, jog, bike, swim, treadmill, elliptical, dance, etc.  This activity should be ABOVE your normal daily activities, such as walking at work. Goal exercise should be at least 150 minutes a week- work your way up to this.   Heart Rate: Your maximum exercise heart rate should be 220 - Your Age in Years. When exercising, get your heart rate up, but avoid going over the maximum targeted heart rate.  60-70% of your maximum heart rate is where you tend to burn the most fat. To find this number:  220 - Age In Years= Max HR  Max HR x 0.6 (or 0.7) = Fat Burning HR The Fat Burning HR is your goal heart rate while working out to burn the most fat.  NEVER exercise to   the point your feel lightheaded, weak, nauseated, dizzy. If you experience ANY of these symptoms- STOP exercise! Allow yourself to cool down and your heart rate to come down. Then restart slower next time.  If at ANY TIME you feel chest pain or chest pressure during exercise, STOP IMMEDIATELY and seek medical attention.   

## 2022-08-29 LAB — COMPREHENSIVE METABOLIC PANEL
ALT: 14 IU/L (ref 0–44)
AST: 12 IU/L (ref 0–40)
Albumin: 4 g/dL — ABNORMAL LOW (ref 4.1–5.1)
Alkaline Phosphatase: 128 IU/L — ABNORMAL HIGH (ref 44–121)
BUN/Creatinine Ratio: 12 (ref 9–20)
BUN: 9 mg/dL (ref 6–20)
Bilirubin Total: 0.3 mg/dL (ref 0.0–1.2)
CO2: 24 mmol/L (ref 20–29)
Calcium: 9.4 mg/dL (ref 8.7–10.2)
Chloride: 97 mmol/L (ref 96–106)
Creatinine, Ser: 0.77 mg/dL (ref 0.76–1.27)
Globulin, Total: 3.8 g/dL (ref 1.5–4.5)
Glucose: 301 mg/dL — ABNORMAL HIGH (ref 70–99)
Potassium: 4.8 mmol/L (ref 3.5–5.2)
Sodium: 135 mmol/L (ref 134–144)
Total Protein: 7.8 g/dL (ref 6.0–8.5)
eGFR: 118 mL/min/{1.73_m2} (ref >59)

## 2022-08-29 LAB — LIPID PANEL
Chol/HDL Ratio: 4.6 ratio (ref 0.0–5.0)
Cholesterol, Total: 164 mg/dL (ref 100–199)
HDL: 36 mg/dL — ABNORMAL LOW (ref >39)
LDL Chol Calc (NIH): 112 mg/dL — ABNORMAL HIGH (ref 0–99)
Triglycerides: 82 mg/dL (ref 0–149)
VLDL Cholesterol Cal: 16 mg/dL (ref 5–40)

## 2022-08-29 LAB — CBC WITH DIFFERENTIAL/PLATELET
Basophils Absolute: 0.1 10*3/uL (ref 0.0–0.2)
Basos: 1 %
EOS (ABSOLUTE): 0.1 10*3/uL (ref 0.0–0.4)
Eos: 2 %
Hematocrit: 45.3 % (ref 37.5–51.0)
Hemoglobin: 13.6 g/dL (ref 13.0–17.7)
Immature Grans (Abs): 0 10*3/uL (ref 0.0–0.1)
Immature Granulocytes: 0 %
Lymphocytes Absolute: 1.9 10*3/uL (ref 0.7–3.1)
Lymphs: 28 %
MCH: 22 pg — ABNORMAL LOW (ref 26.6–33.0)
MCHC: 30 g/dL — ABNORMAL LOW (ref 31.5–35.7)
MCV: 73 fL — ABNORMAL LOW (ref 79–97)
Monocytes Absolute: 0.7 10*3/uL (ref 0.1–0.9)
Monocytes: 10 %
Neutrophils Absolute: 4 10*3/uL (ref 1.4–7.0)
Neutrophils: 59 %
Platelets: 406 10*3/uL (ref 150–450)
RBC: 6.19 x10E6/uL — ABNORMAL HIGH (ref 4.14–5.80)
RDW: 15.8 % — ABNORMAL HIGH (ref 11.6–15.4)
WBC: 6.8 10*3/uL (ref 3.4–10.8)

## 2022-08-29 LAB — HEMOGLOBIN A1C
Est. average glucose Bld gHb Est-mCnc: 309 mg/dL
Hgb A1c MFr Bld: 12.4 % — ABNORMAL HIGH (ref 4.8–5.6)

## 2022-08-29 LAB — TSH: TSH: 2.15 u[IU]/mL (ref 0.450–4.500)

## 2022-08-29 LAB — VITAMIN D 25 HYDROXY (VIT D DEFICIENCY, FRACTURES): Vit D, 25-Hydroxy: 5.8 ng/mL — ABNORMAL LOW (ref 30.0–100.0)

## 2022-08-30 ENCOUNTER — Telehealth: Payer: Self-pay

## 2022-08-30 NOTE — Progress Notes (Signed)
  Care Management   Note  08/30/2022 Name: Matthew Wiggins MRN: [id number] DOB: May 14, 1985  [name] is a 36 y.o. year old male who is a primary care patient of Early, Sung Amabile, NP. I reached out to [name] by phone today offer care management services.   Mr. Locher was given information about care management services today including:  Care management services include personalized support from designated clinical staff, including individualized plan of care and coordination with other care providers 24/7 contact phone numbers for assistance for urgent and routine care needs. The patient may stop care management services at any time by phone call to the office staff.  Patient agreed to services and verbal consent obtained.   Follow up plan: Telephone appointment with care management team member scheduled for:09/04/2022 LCSW 09/05/2022  Penne Lash, RMA Care Guide [facility]  [place], [place] [address] Direct Dial: [phone] [email or web address]

## 2022-08-31 DIAGNOSIS — Z0000 Encounter for general adult medical examination without abnormal findings: Secondary | ICD-10-CM | POA: Insufficient documentation

## 2022-08-31 NOTE — Assessment & Plan Note (Signed)
Chronic. Will obtain labs today for evaluation. We will take this journey one step at a time. I feel that with improved blood sugar and dietary measures we will be able to help him achieve a healthy weight.

## 2022-08-31 NOTE — Assessment & Plan Note (Signed)
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 Will send treatment with oral and topical medications. I am hopeful with improved control of blood sugars and weight management this will improve.

## 2022-08-31 NOTE — Assessment & Plan Note (Signed)

## 2022-08-31 NOTE — Assessment & Plan Note (Addendum)
Chronic. BP is not controlled today. He is not currently on his medications, which explains this. Will obtain labs and plan to restart medications. Samples provided today to get him started.

## 2022-08-31 NOTE — Assessment & Plan Note (Addendum)
Chronic. Not currently on medication at this time, susupect this is not well controlled. Plan to obtain labs and restart medications. Samples provided today for immediate use. Depending on insurance coverage specifics, we may need to make changes.

## 2022-09-04 ENCOUNTER — Other Ambulatory Visit: Payer: Self-pay

## 2022-09-04 ENCOUNTER — Other Ambulatory Visit (HOSPITAL_BASED_OUTPATIENT_CLINIC_OR_DEPARTMENT_OTHER): Payer: Self-pay

## 2022-09-04 DIAGNOSIS — E1169 Type 2 diabetes mellitus with other specified complication: Secondary | ICD-10-CM

## 2022-09-04 DIAGNOSIS — E1159 Type 2 diabetes mellitus with other circulatory complications: Secondary | ICD-10-CM

## 2022-09-04 MED ORDER — VITAMIN D (ERGOCALCIFEROL) 1.25 MG (50000 UNIT) PO CAPS
50000.0000 [IU] | ORAL_CAPSULE | ORAL | 3 refills | Status: AC
Start: 2022-09-04 — End: ?
  Filled 2022-09-04: qty 4, 28d supply, fill #0
  Filled 2023-01-22 – 2023-01-29 (×2): qty 4, 28d supply, fill #1
  Filled 2023-04-25 – 2023-06-28 (×3): qty 4, 28d supply, fill #2

## 2022-09-04 NOTE — Patient Outreach (Signed)
Care Management   Visit Note  09/04/2022 Name: Matthew Wiggins MRN: [id number] DOB: 11-13-1985  Subjective: Matthew Wiggins is a 37 y.o. year old male who is a primary care patient of Early, Sung Amabile, NP. The Care Management team was consulted for assistance.      Engaged with patient spoke with patient by telephone.    Goals Addressed             This Visit's Progress    RNCM Care Management for Effective Management of DM, HTN, and Obesity       Current Barriers:  Knowledge Deficits related to plan of care for management of HTN, DMII, and Obesity  Care Coordination needs related to Limited access to food and Lacks knowledge of community resource: for effective management of obesity, HTN, DM and mental health needs   Chronic Disease Management support and education needs related to HTN, DMII, and Obesity Financial Constraints.  Difficulty obtaining medications  RNCM Clinical Goal(s):  Patient will verbalize basic understanding of HTN, DMII, and Obesity disease process and self health management plan as evidenced by following the plan of care, blood pressures WNL, Blood sugars trending down and stable, A1C,7, healthy weight loss, and establishing positive health and wellness goals for effective management of chronic conditions take all medications exactly as prescribed and will call provider for medication related questions as evidenced by compliance with medications and calling the pcp and pharm D for changes or new needs related to medication needs.     demonstrate improved and ongoing adherence to prescribed treatment plan for HTN, DMII, and Obesity as evidenced by normalized blood pressures and blood sugars, trending downward A1C, and healthy weight loss work with pharmacist to address Financial constraints related to medication cost and having no insurance and Medication procurement related to HTN, DMII, and Obesity as evidenced by review of EMR and patient or pharmacist report     work with Child psychotherapist to address Financial constraints related to food resources, eye exam and glasses, and unemployement, Limited access to food, and Obesity resources related to the management of HTN, DMII, and Obesity as evidenced by review of EMR and patient or Child psychotherapist report     work with Publishing rights manager care guide to address needs related to Limited access to food and resources for eye exam and glasses as evidenced by patient and/or community resource care guide support    demonstrate ongoing self health care management ability for effective management of HTN, DM, and Obesity as evidenced by     through collaboration with Medical illustrator, provider, and care team.   Interventions: Evaluation of current treatment plan related to  self management and patient's adherence to plan as established by provider   Diabetes:  (Status: New goal.) Long Term Goal   Lab Results  Component Value Date   HGBA1C 12.4 (H) 08/28/2022   Assessed patient's understanding of A1c goal: <7% Provided education to patient about basic DM disease process; Reviewed medications with patient and discussed importance of medication adherence. The patient has the meter for checking blood sugars but not the strips. Message sent to the pharm D and pcp for assistance with expressed needs;        Reviewed prescribed diet with patient heart healthy/ADA diet- sent planning healthy meals by email and also gave information on the ADA website for review and assistance; Counseled on importance of regular laboratory monitoring as prescribed;        Discussed plans with patient  for ongoing care management follow up and provided patient with direct contact information for care management team;      Provided patient with written educational materials related to hypo and hyperglycemia and importance of correct treatment. Sent via email information on hypo and hyperglycemia;       Reviewed scheduled/upcoming provider appointments  including: 10-11-2022;         Advised patient, providing education and rationale, to check cbg 4 times daily, when you have symptoms of low or high blood sugar, and before and after exercise and record. The patient does not have strips and ask for help with getting strips for his meter. Education provided.         call provider for findings outside established parameters;       Referral made to pharmacy team for assistance with for assistance with medication cost, medication reconciliation, and help with options for DM management and HTN management;       Referral made to social work team for assistance with food resources, cost effective eye care, and other needs due to the patient being unemployed;      Review of patient status, including review of consultants reports, relevant laboratory and other test results, and medications completed;       Advised patient to discuss call the office for acute changes in his blood sugars, questions and concerns with provider;      Screening for signs and symptoms of depression related to chronic disease state;        Assessed social determinant of health barriers;         Hypertension: (Status: New goal.) Long Term Goal  Last practice recorded BP readings:  BP Readings from Last 3 Encounters:  08/28/22 (!) 120/100  08/29/21 (!) 117/108  10/17/20 136/86   Most recent eGFR/CrCl:  Lab Results  Component Value Date   EGFR 118 08/28/2022    No components found for: "CRCL"  Evaluation of current treatment plan related to hypertension self management and patient's adherence to plan as established by provider;   Provided education to patient re: stroke prevention, s/s of heart attack and stroke; Reviewed prescribed diet heart healthy/ADA diet  Reviewed medications with patient and discussed importance of compliance;  Counseled on the importance of exercise goals with target of 150 minutes per week Discussed plans with patient for ongoing care management  follow up and provided patient with direct contact information for care management team; Advised patient, providing education and rationale, to monitor blood pressure daily and record, calling PCP for findings outside established parameters;  Reviewed scheduled/upcoming provider appointments including:  Advised patient to discuss healthy weight loss program, sleep apnea concerns, questions and concerns about heart health with provider; Provided education on prescribed diet heart Healthy/ADA diet. Will send the patient information about heart healthy/ADA diet ;  Discussed complications of poorly controlled blood pressure such as heart disease, stroke, circulatory complications, vision complications, kidney impairment, sexual dysfunction;  Screening for signs and symptoms of depression related to chronic disease state;  Assessed social determinant of health barriers;   Weight Loss:  (Status: New goal.)  Advised patient to discuss with primary care provider options regarding weight management;  Provided verbal and/or written education to patient re: provider recommended life style modifications;  Screening for signs and symptoms of depression;  Offered to connect patient with psychology or social work support for counseling and supportive care;  Reviewed recommended dietary changes: avoid fad diets, make small/incremental dietary and exercise changes, eat at  the table and avoid eating in front of the TV, plan management of cravings, monitor snacking and cravings in food diary; Advised patient to discuss healthy weight loss options  with provider; Assessed social determinant of health barriers;   Patient Goals/Self-Care Activities: Take medications as prescribed   Attend all scheduled provider appointments Call pharmacy for medication refills 3-7 days in advance of running out of medications Perform all self care activities independently  Perform IADL's (shopping, preparing meals, housekeeping,  managing finances) independently Call provider office for new concerns or questions  Work with the social worker to address care coordination needs and will continue to work with the clinical team to address health care and disease management related needs Work with the pharmacist to address medication management needs and will continue to work with the clinical team to address health care and disease management related needs call the Suicide and Crisis Lifeline: 988 call the [place] National Suicide Prevention Lifeline: (623) 518-8486 or TTY: (984)225-4946 TTY 575-153-4090) to talk to a trained counselor call 1-800-273-TALK (toll free, 24 hour hotline) go to [facility] [address] [place] [phone]) if experiencing a Mental Health or Behavioral Health Crisis  schedule appointment with eye doctor check blood sugar at prescribed times: 4 times daily, when you have symptoms of low or high blood sugar, and before and after exercise check feet daily for cuts, sores or redness enter blood sugar readings and medication or insulin into daily log take the blood sugar log to all doctor visits trim toenails straight across drink 6 to 8 glasses of water each day eat fish at least once per week fill half of plate with vegetables join a weight loss program limit fast food meals to no more than 1 per week manage portion size prepare main meal at home 3 to 5 days each week read food labels for fat, fiber, carbohydrates and portion size reduce red meat to 2 to 3 times a week set a realistic goal switch to sugar-free drinks keep feet up while sitting wash and dry feet carefully every day wear comfortable, cotton socks wear comfortable, well-fitting shoes check blood pressure weekly choose a place to take my blood pressure (home, clinic or office, retail store) learn about high blood pressure call doctor for signs and symptoms of high blood pressure develop  an action plan for high blood pressure keep all doctor appointments take medications for blood pressure exactly as prescribed begin an exercise program report new symptoms to your doctor eat more whole grains, fruits and vegetables, lean meats and healthy fats             Consent to Services:  Patient was given information about care management services, agreed to services, and gave verbal consent to participate.   Plan: Telephone follow up appointment with care management team member scheduled for: 10-03-2022 at 1 pm  Alto Denver RN, MSN, CCM RN Care Manager  [facility]  Ambulatory Care Management  Direct Number: [phone]

## 2022-09-04 NOTE — Patient Instructions (Signed)
Visit Information  Thank you for taking time to visit with me today. Please don't hesitate to contact me if I can be of assistance to you before our next scheduled telephone appointment.  Following are the goals we discussed today:   Goals Addressed             This Visit's Progress    RNCM Care Management for Effective Management of DM, HTN, and Obesity       Current Barriers:  Knowledge Deficits related to plan of care for management of HTN, DMII, and Obesity  Care Coordination needs related to Limited access to food and Lacks knowledge of community resource: for effective management of obesity, HTN, DM and mental health needs   Chronic Disease Management support and education needs related to HTN, DMII, and Obesity Financial Constraints.  Difficulty obtaining medications  RNCM Clinical Goal(s):  Patient will verbalize basic understanding of HTN, DMII, and Obesity disease process and self health management plan as evidenced by following the plan of care, blood pressures WNL, Blood sugars trending down and stable, A1C,7, healthy weight loss, and establishing positive health and wellness goals for effective management of chronic conditions take all medications exactly as prescribed and will call provider for medication related questions as evidenced by compliance with medications and calling the pcp and pharm D for changes or new needs related to medication needs.     demonstrate improved and ongoing adherence to prescribed treatment plan for HTN, DMII, and Obesity as evidenced by normalized blood pressures and blood sugars, trending downward A1C, and healthy weight loss work with pharmacist to address Financial constraints related to medication cost and having no insurance and Medication procurement related to HTN, DMII, and Obesity as evidenced by review of EMR and patient or pharmacist report    work with Child psychotherapist to address Financial constraints related to food resources, eye exam  and glasses, and unemployement, Limited access to food, and Obesity resources related to the management of HTN, DMII, and Obesity as evidenced by review of EMR and patient or Child psychotherapist report     work with Publishing rights manager care guide to address needs related to Limited access to food and resources for eye exam and glasses as evidenced by patient and/or community resource care guide support    demonstrate ongoing self health care management ability for effective management of HTN, DM, and Obesity as evidenced by     through collaboration with Medical illustrator, provider, and care team.   Interventions: Evaluation of current treatment plan related to  self management and patient's adherence to plan as established by provider   Diabetes:  (Status: New goal.) Long Term Goal   Lab Results  Component Value Date   HGBA1C 12.4 (H) 08/28/2022   Assessed patient's understanding of A1c goal: <7% Provided education to patient about basic DM disease process; Reviewed medications with patient and discussed importance of medication adherence. The patient has the meter for checking blood sugars but not the strips. Message sent to the pharm D and pcp for assistance with expressed needs;        Reviewed prescribed diet with patient heart healthy/ADA diet- sent planning healthy meals by email and also gave information on the ADA website for review and assistance; Counseled on importance of regular laboratory monitoring as prescribed;        Discussed plans with patient for ongoing care management follow up and provided patient with direct contact information for care management team;  Provided patient with written educational materials related to hypo and hyperglycemia and importance of correct treatment. Sent via email information on hypo and hyperglycemia;       Reviewed scheduled/upcoming provider appointments including: 10-11-2022;         Advised patient, providing education and rationale, to check  cbg 4 times daily, when you have symptoms of low or high blood sugar, and before and after exercise and record. The patient does not have strips and ask for help with getting strips for his meter. Education provided.         call provider for findings outside established parameters;       Referral made to pharmacy team for assistance with for assistance with medication cost, medication reconciliation, and help with options for DM management and HTN management;       Referral made to social work team for assistance with food resources, cost effective eye care, and other needs due to the patient being unemployed;      Review of patient status, including review of consultants reports, relevant laboratory and other test results, and medications completed;       Advised patient to discuss call the office for acute changes in his blood sugars, questions and concerns with provider;      Screening for signs and symptoms of depression related to chronic disease state;        Assessed social determinant of health barriers;         Hypertension: (Status: New goal.) Long Term Goal  Last practice recorded BP readings:  BP Readings from Last 3 Encounters:  08/28/22 (!) 120/100  08/29/21 (!) 117/108  10/17/20 136/86   Most recent eGFR/CrCl:  Lab Results  Component Value Date   EGFR 118 08/28/2022    No components found for: "CRCL"  Evaluation of current treatment plan related to hypertension self management and patient's adherence to plan as established by provider;   Provided education to patient re: stroke prevention, s/s of heart attack and stroke; Reviewed prescribed diet heart healthy/ADA diet  Reviewed medications with patient and discussed importance of compliance;  Counseled on the importance of exercise goals with target of 150 minutes per week Discussed plans with patient for ongoing care management follow up and provided patient with direct contact information for care management  team; Advised patient, providing education and rationale, to monitor blood pressure daily and record, calling PCP for findings outside established parameters;  Reviewed scheduled/upcoming provider appointments including:  Advised patient to discuss healthy weight loss program, sleep apnea concerns, questions and concerns about heart health with provider; Provided education on prescribed diet heart Healthy/ADA diet. Will send the patient information about heart healthy/ADA diet ;  Discussed complications of poorly controlled blood pressure such as heart disease, stroke, circulatory complications, vision complications, kidney impairment, sexual dysfunction;  Screening for signs and symptoms of depression related to chronic disease state;  Assessed social determinant of health barriers;   Weight Loss:  (Status: New goal.)  Advised patient to discuss with primary care provider options regarding weight management;  Provided verbal and/or written education to patient re: provider recommended life style modifications;  Screening for signs and symptoms of depression;  Offered to connect patient with psychology or social work support for counseling and supportive care;  Reviewed recommended dietary changes: avoid fad diets, make small/incremental dietary and exercise changes, eat at the table and avoid eating in front of the TV, plan management of cravings, monitor snacking and cravings in food diary; Advised  patient to discuss healthy weight loss options  with provider; Assessed social determinant of health barriers;   Patient Goals/Self-Care Activities: Take medications as prescribed   Attend all scheduled provider appointments Call pharmacy for medication refills 3-7 days in advance of running out of medications Perform all self care activities independently  Perform IADL's (shopping, preparing meals, housekeeping, managing finances) independently Call provider office for new concerns or questions   Work with the social worker to address care coordination needs and will continue to work with the clinical team to address health care and disease management related needs Work with the pharmacist to address medication management needs and will continue to work with the clinical team to address health care and disease management related needs call the Suicide and Crisis Lifeline: 988 call the [place] National Suicide Prevention Lifeline: (843) 738-0155 or TTY: 502-189-1863 TTY (514)237-1061) to talk to a trained counselor call 1-800-273-TALK (toll free, 24 hour hotline) go to [facility] [address], [place] [phone]) if experiencing a Mental Health or Behavioral Health Crisis  schedule appointment with eye doctor check blood sugar at prescribed times: 4 times daily, when you have symptoms of low or high blood sugar, and before and after exercise check feet daily for cuts, sores or redness enter blood sugar readings and medication or insulin into daily log take the blood sugar log to all doctor visits trim toenails straight across drink 6 to 8 glasses of water each day eat fish at least once per week fill half of plate with vegetables join a weight loss program limit fast food meals to no more than 1 per week manage portion size prepare main meal at home 3 to 5 days each week read food labels for fat, fiber, carbohydrates and portion size reduce red meat to 2 to 3 times a week set a realistic goal switch to sugar-free drinks keep feet up while sitting wash and dry feet carefully every day wear comfortable, cotton socks wear comfortable, well-fitting shoes check blood pressure weekly choose a place to take my blood pressure (home, clinic or office, retail store) learn about high blood pressure call doctor for signs and symptoms of high blood pressure develop an action plan for high blood pressure keep all doctor appointments take  medications for blood pressure exactly as prescribed begin an exercise program report new symptoms to your doctor eat more whole grains, fruits and vegetables, lean meats and healthy fats           Our next appointment is by telephone on 10-03-2022 at 1 pm  Please call the care guide team at [phone] if you need to cancel or reschedule your appointment.   If you are experiencing a Mental Health or Behavioral Health Crisis or need someone to talk to, please call the Suicide and Crisis Lifeline: 988 call the [place] National Suicide Prevention Lifeline: 507-604-8127 or TTY: 520-860-6165 TTY (435)282-3538) to talk to a trained counselor call 1-800-273-TALK (toll free, 24 hour hotline) go to [facility] [address], [place] [phone])   Following is a copy of your full plan of care:  There are no care plans that you recently modified to display for this patient.   Mr. Heckmann was given information about Care Management services by the embedded care coordination team including:  Care Management services include personalized support from designated clinical staff supervised by his physician, including individualized plan of care and coordination with other care providers 24/7 contact phone numbers  for assistance for urgent and routine care needs. The patient may stop CCM services at any time (effective at the end of the month) by phone call to the office staff.  Patient agreed to services and verbal consent obtained.   The patient verbalized understanding of instructions, educational materials, and care plan provided today and agreed to receive a mailed copy of patient instructions, educational materials, and care plan.   Telephone follow up appointment with care management team member scheduled for: 10-03-2022 at 1 pm  Alto Denver RN, MSN, CCM RN Care Manager  [facility]  Direct Number: [phone]   Diabetes  Mellitus and Nutrition, Adult When you have diabetes, or diabetes mellitus, it is very important to have healthy eating habits because your blood sugar (glucose) levels are greatly affected by what you eat and drink. Eating healthy foods in the right amounts, at about the same times every day, can help you: Manage your blood glucose. Lower your risk of heart disease. Improve your blood pressure. Reach or maintain a healthy weight. What can affect my meal plan? Every person with diabetes is different, and each person has different needs for a meal plan. Your health care provider may recommend that you work with a dietitian to make a meal plan that is best for you. Your meal plan may vary depending on factors such as: The calories you need. The medicines you take. Your weight. Your blood glucose, blood pressure, and cholesterol levels. Your activity level. Other health conditions you have, such as heart or kidney disease. How do carbohydrates affect me? Carbohydrates, also called carbs, affect your blood glucose level more than any other type of food. Eating carbs raises the amount of glucose in your blood. It is important to know how many carbs you can safely have in each meal. This is different for every person. Your dietitian can help you calculate how many carbs you should have at each meal and for each snack. How does alcohol affect me? Alcohol can cause a decrease in blood glucose (hypoglycemia), especially if you use insulin or take certain diabetes medicines by mouth. Hypoglycemia can be a life-threatening condition. Symptoms of hypoglycemia, such as sleepiness, dizziness, and confusion, are similar to symptoms of having too much alcohol. Do not drink alcohol if: Your health care provider tells you not to drink. You are pregnant, may be pregnant, or are planning to become pregnant. If you drink alcohol: Limit how much you have to: 0-1 drink a day for women. 0-2 drinks a day for  men. Know how much alcohol is in your drink. In the [place] one drink equals one 12 oz bottle of beer (355 mL), one 5 oz glass of wine (148 mL), or one 1 oz glass of hard liquor (44 mL). Keep yourself hydrated with water, diet soda, or unsweetened iced tea. Keep in mind that regular soda, juice, and other mixers may contain a lot of sugar and must be counted as carbs. What are tips for following this plan?  Reading food labels Start by checking the serving size on the Nutrition Facts label of packaged foods and drinks. The number of calories and the amount of carbs, fats, and other nutrients listed on the label are based on one serving of the item. Many items contain more than one serving per package. Check the total grams (g) of carbs in one serving. Check the number of grams of saturated fats and trans fats in one serving. Choose foods that  have a low amount or none of these fats. Check the number of milligrams (mg) of salt (sodium) in one serving. Most people should limit total sodium intake to less than 2,300 mg per day. Always check the nutrition information of foods labeled as "low-fat" or "nonfat." These foods may be higher in added sugar or refined carbs and should be avoided. Talk to your dietitian to identify your daily goals for nutrients listed on the label. Shopping Avoid buying canned, pre-made, or processed foods. These foods tend to be high in fat, sodium, and added sugar. Shop around the outside edge of the grocery store. This is where you will most often find fresh fruits and vegetables, bulk grains, fresh meats, and fresh dairy products. Cooking Use low-heat cooking methods, such as baking, instead of high-heat cooking methods, such as deep frying. Cook using healthy oils, such as olive, canola, or sunflower oil. Avoid cooking with butter, cream, or high-fat meats. Meal planning Eat meals and snacks regularly, preferably at the same times every day. Avoid going long periods of  time without eating. Eat foods that are high in fiber, such as fresh fruits, vegetables, beans, and whole grains. Eat 4-6 oz (112-168 g) of lean protein each day, such as lean meat, chicken, fish, eggs, or tofu. One ounce (oz) (28 g) of lean protein is equal to: 1 oz (28 g) of meat, chicken, or fish. 1 egg.  cup (62 g) of tofu. Eat some foods each day that contain healthy fats, such as avocado, nuts, seeds, and fish. What foods should I eat? Fruits Berries. Apples. Oranges. Peaches. Apricots. Plums. Grapes. Mangoes. Papayas. Pomegranates. Kiwi. Cherries. Vegetables Leafy greens, including lettuce, spinach, kale, chard, collard greens, mustard greens, and cabbage. Beets. Cauliflower. Broccoli. Carrots. Green beans. Tomatoes. Peppers. Onions. Cucumbers. Brussels sprouts. Grains Whole grains, such as whole-wheat or whole-grain bread, crackers, tortillas, cereal, and pasta. Unsweetened oatmeal. Quinoa. Brown or wild rice. Meats and other proteins Seafood. Poultry without skin. Lean cuts of poultry and beef. Tofu. Nuts. Seeds. Dairy Low-fat or fat-free dairy products such as milk, yogurt, and cheese. The items listed above may not be a complete list of foods and beverages you can eat and drink. Contact a dietitian for more information. What foods should I avoid? Fruits Fruits canned with syrup. Vegetables Canned vegetables. Frozen vegetables with butter or cream sauce. Grains Refined white flour and flour products such as bread, pasta, snack foods, and cereals. Avoid all processed foods. Meats and other proteins Fatty cuts of meat. Poultry with skin. Breaded or fried meats. Processed meat. Avoid saturated fats. Dairy Full-fat yogurt, cheese, or milk. Beverages Sweetened drinks, such as soda or iced tea. The items listed above may not be a complete list of foods and beverages you should avoid. Contact a dietitian for more information. Questions to ask a health care provider Do I need to  meet with a certified diabetes care and education specialist? Do I need to meet with a dietitian? What number can I call if I have questions? When are the best times to check my blood glucose? Where to find more information: American Diabetes Association: diabetes.org Academy of Nutrition and Dietetics: eatright.[employer] of Diabetes and Digestive and Kidney Diseases: [email or web address].si Association of Diabetes Care & Education Specialists: diabeteseducator.org Summary It is important to have healthy eating habits because your blood sugar (glucose) levels are greatly affected by what you eat and drink. It is important to use alcohol carefully. A healthy meal plan will help you manage  your blood glucose and lower your risk of heart disease. Your health care provider may recommend that you work with a dietitian to make a meal plan that is best for you. This information is not intended to replace advice given to you by your health care provider. Make sure you discuss any questions you have with your health care provider. Document Revised: 07/22/2019 Document Reviewed: 07/22/2019 Elsevier Patient Education  2024 Elsevier Inc. Diabetes Mellitus and Foot Care Diabetes, also called diabetes mellitus, may cause problems with your feet and legs because of poor blood flow (circulation). Poor circulation may make your skin: Become thinner and drier. Break more easily. Heal more slowly. Peel and crack. You may also have nerve damage (neuropathy). This can cause decreased feeling in your legs and feet. This means that you may not notice minor injuries to your feet that could lead to more serious problems. Finding and treating problems early is the best way to prevent future foot problems. How to care for your feet Foot hygiene  Wash your feet daily with warm water and mild soap. Do not use hot water. Then, pat your feet and the areas between your toes until they are fully dry. Do not soak your  feet. This can dry your skin. Trim your toenails straight across. Do not dig under them or around the cuticle. File the edges of your nails with an emery board or nail file. Apply a moisturizing lotion or petroleum jelly to the skin on your feet and to dry, brittle toenails. Use lotion that does not contain alcohol and is unscented. Do not apply lotion between your toes. Shoes and socks Wear clean socks or stockings every day. Make sure they are not too tight. Do not wear knee-high stockings. These may decrease blood flow to your legs. Wear shoes that fit well and have enough cushioning. Always look in your shoes before you put them on to be sure there are no objects inside. To break in new shoes, wear them for just a few hours a day. This prevents injuries on your feet. Wounds, scrapes, corns, and calluses  Check your feet daily for blisters, cuts, bruises, sores, and redness. If you cannot see the bottom of your feet, use a mirror or ask someone for help. Do not cut off corns or calluses or try to remove them with medicine. If you find a minor scrape, cut, or break in the skin on your feet, keep it and the skin around it clean and dry. You may clean these areas with mild soap and water. Do not clean the area with peroxide, alcohol, or iodine. If you have a wound, scrape, corn, or callus on your foot, look at it several times a day to make sure it is healing and not infected. Check for: Redness, swelling, or pain. Fluid or blood. Warmth. Pus or a bad smell. General tips Do not cross your legs. This may decrease blood flow to your feet. Do not use heating pads or hot water bottles on your feet. They may burn your skin. If you have lost feeling in your feet or legs, you may not know this is happening until it is too late. Protect your feet from hot and cold by wearing shoes, such as at the beach or on hot pavement. Schedule a complete foot exam at least once a year or more often if you have foot  problems. Report any cuts, sores, or bruises to your health care provider right away. Where to find  more information American Diabetes Association: diabetes.org Association of Diabetes Care & Education Specialists: diabeteseducator.org Contact a health care provider if: You have a condition that increases your risk of infection, and you have any cuts, sores, or bruises on your feet. You have an injury that is not healing. You have redness on your legs or feet. You feel burning or tingling in your legs or feet. You have pain or cramps in your legs and feet. Your legs or feet are numb. Your feet always feel cold. You have pain around any toenails. Get help right away if: You have a wound, scrape, corn, or callus on your foot and: You have signs of infection. You have a fever. You have a red line going up your leg. This information is not intended to replace advice given to you by your health care provider. Make sure you discuss any questions you have with your health care provider. Document Revised: 06/21/2021 Document Reviewed: 06/21/2021 Elsevier Patient Education  2024 Elsevier Inc. Diabetes Mellitus and Exercise Regular exercise is important for your health, especially if you have diabetes mellitus. Exercise is not just about losing weight. It can also help you increase muscle strength and bone density and reduce body fat and stress. This can help your level of endurance and make you more fit and flexible. Why should I exercise if I have diabetes? Exercise has many benefits for people with diabetes. It can: Help lower and control your blood sugar (glucose). Help your body respond better and become more sensitive to the hormone insulin. Reduce how much insulin your body needs. Lower your risk for heart disease by: Lowering how much "bad" cholesterol and triglycerides you have in your body. Increasing how much "good" cholesterol you have in your body. Lowering your blood  pressure. Lowering your blood glucose levels. What is my activity plan? Your health care provider or an expert trained in diabetes care (certified diabetes educator) can help you make an activity plan. This plan can help you find the type of exercise that works for you. It may also tell you how often to exercise and for how long. Be sure to: Get at least 150 minutes of medium-intensity or high-intensity exercise each week. This may involve brisk walking, biking, or water aerobics. Do stretching and strengthening exercises at least 2 times a week. This may involve yoga or weight lifting. Spread out your activity over at least 3 days of the week. Get some form of physical activity each day. Do not go more than 2 days in a row without some kind of activity. Avoid being inactive for more than 30 minutes at a time. Take frequent breaks to walk or stretch. Choose activities that you enjoy. Set goals that you know you can accomplish. Start slowly and increase the intensity of your exercise over time. How do I manage my diabetes during exercise?  Monitor your blood glucose Check your blood glucose before and after you exercise. If your blood glucose is 240 mg/dL (16.1 mmol/L) or higher before you exercise, check your urine for ketones. These are chemicals created by the liver. If you have ketones in your urine, do not exercise until your blood glucose returns to normal. If your blood glucose is 100 mg/dL (5.6 mmol/L) or lower, eat a snack that has 15-20 grams of carbohydrate in it. Check your blood glucose 15 minutes after the snack to make sure that your level is above 100 mg/dL (5.6 mmol/L) before you start to exercise. Your risk for low  blood glucose (hypoglycemia) goes up during and after exercise. Know the symptoms of this condition and how to treat it. Follow these instructions at home: Keep a carbohydrate snack on hand for use before, during, and after exercise. This can help prevent or treat  hypoglycemia. Avoid injecting insulin into parts of your body that are going to be used during exercise. This may include: Your arms, when you are going to play tennis. Your legs, when you are about to go jogging. Keep track of your exercise habits. This can help you and your health care provider watch and adjust your activity plan. Write down: What you eat before and after you exercise. Blood glucose levels before and after you exercise. The type and amount of exercise you do. Talk to your health care provider before you start a new activity. They may need to: Make sure that the activity is safe for you. Adjust your insulin, other medicines, and food that you eat. Drink water while you exercise. This can stop you from losing too much water (dehydration). It can also prevent problems caused by having a lot of heat in your body (heat stroke). Where to find more information American Diabetes Association: diabetes.org Association of Diabetes Care & Education Specialists: diabeteseducator.org This information is not intended to replace advice given to you by your health care provider. Make sure you discuss any questions you have with your health care provider. Document Revised: 06/07/2021 Document Reviewed: 06/07/2021 Elsevier Patient Education  2024 Else[employer]. Hyperglycemia Hyperglycemia is when the sugar (glucose) level in your blood is too high. High blood sugar can happen to people who have or do not have diabetes. High blood sugar can happen quickly. It can be an emergency. What are the causes? If you have diabetes, high blood sugar may be caused by: Medicines that increase blood sugar or affect your control of diabetes. Getting less physical activity. Overeating. Being sick or injured or having an infection. Having surgery. Stress. Not giving yourself enough insulin (if you are taking it). You may have high blood sugar because you have diabetes that has not been diagnosed yet. If  you do not have diabetes, high blood sugar may be caused by: Certain medicines. Stress. A bad illness. An infection. Having surgery. Diseases of the pancreas. What increases the risk? This condition is more likely to develop in people who have risk factors for diabetes, such as: Having a family member with diabetes. Certain conditions in which the body's defense system (immune system) attacks itself. These are called autoimmune disorders. Being overweight. Not being active. Having a condition called insulin resistance. Having a history of: Prediabetes. Diabetes when pregnant. Polycystic ovarian syndrome (PCOS). What are the signs or symptoms? This condition may not cause symptoms. If you do have symptoms, they may include: Feeling more thirsty than normal. Needing to pee (urinate) more often than normal. Hunger. Feeling very tired. Blurry eyesight (vision). You may get other symptoms as the condition gets worse, such as: Dry mouth. Pain in your belly (abdomen). Not being hungry (loss of appetite). Breath that smells fruity. Weakness. Weight loss that is not planned. A tingling or numb feeling in your hands or feet. A headache. Cuts or bruises that heal slowly. How is this treated? Treatment depends on the cause of your condition. Treatment may include: Taking medicine to control your blood sugar levels. Changing your medicine or dosage if you take insulin or other diabetes medicines. Lifestyle changes. These may include: Exercising more. Eating healthier foods. Losing weight.  Treating an illness or infection. Checking your blood sugar more often. Stopping or reducing steroid medicines. If your condition gets very bad, you will need to be treated in the hospital. Follow these instructions at home: General instructions Take over-the-counter and prescription medicines only as told by your doctor. Do not smoke or use any products that contain nicotine or tobacco. If you  need help quitting, ask your doctor. If you drink alcohol: Limit how much you have to: 0-1 drink a day for women who are not pregnant. 0-2 drinks a day for men. Know how much alcohol is in a drink. In the [place] one drink equals one 12 oz bottle of beer (355 mL), one 5 oz glass of wine (148 mL), or one 1 oz glass of hard liquor (44 mL). Manage stress. If you need help with this, ask your doctor. Do exercises as told by your doctor. Keep all follow-up visits. Eating and drinking  Stay at a healthy weight. Make sure you drink enough fluid when you: Exercise. Get sick. Are in hot temperatures. Drink enough fluid to keep your pee (urine) pale yellow. If you have diabetes:  Know the symptoms of high blood sugar. Follow your diabetes management plan as told by your doctor. Make sure you: Take insulin and medicines as told. Follow your exercise plan. Follow your meal plan. Eat on time. Do not skip meals. Check your blood sugar as often as told. Make sure you check before and after exercise. If you exercise longer or in a different way, check your blood sugar more often. Follow your sick day plan whenever you cannot eat or drink normally. Make this plan ahead of time with your doctor. Share your diabetes management plan with people in your workplace, school, and household. Check your pee for ketones when you are ill and as told by your doctor. Carry a card or wear jewelry that says that you have diabetes. Where to find more information American Diabetes Association: www.diabetes.org Contact a doctor if: Your blood sugar level is at or above 240 mg/dL (24.4 mmol/L) for 2 days in a row. You have problems keeping your blood sugar in your target range. You have high blood pressure often. You have signs of illness, such as: Feeling like you may vomit (feeling nauseous). Vomiting. A fever. Get help right away if: Your blood sugar monitor reads "high" even when you are taking insulin. You  have trouble breathing. You have a change in how you think, feel, or act (mental status). You feel like you may vomit, and the feeling does not go away. You cannot stop vomiting. These symptoms may be an emergency. Get medical help right away. Call your local emergency services (911 in the [place] Do not wait to see if the symptoms will go away. Do not drive yourself to the hospital. Summary Hyperglycemia is when the sugar (glucose) level in your blood is too high. High blood sugar can happen to people who have or do not have diabetes. Make sure you drink enough fluids and follow your meal plan. Exercise as often as told by your doctor. Contact your doctor if you have problems keeping your blood sugar in your target range. This information is not intended to replace advice given to you by your health care provider. Make sure you discuss any questions you have with your health care provider. Document Revised: 10/01/2019 Document Reviewed: 10/02/2019 Elsevier Patient Education  2024 [employer] Inc. Hypoglycemia Hypoglycemia is when the sugar (glucose) level in your  blood is too low. Low blood sugar can happen to people who have diabetes and people who do not have diabetes. Low blood sugar can happen quickly, and it can be an emergency. What are the causes? This condition happens most often in people who have diabetes. It may be caused by: Diabetes medicine. Not eating enough, or not eating often enough. Doing more physical activity. Drinking alcohol on an empty stomach. If you do not have diabetes, this condition may be caused by: A tumor in the pancreas. Not eating enough, or not eating for long periods at a time (fasting). A very bad infection or illness. Problems after having weight loss (bariatric) surgery. Kidney failure or liver failure. Certain medicines. What increases the risk? This condition is more likely to develop in people who: Have diabetes and take medicines to lower their  blood sugar. Abuse alcohol. Have a very bad illness. What are the signs or symptoms? Mild Hunger. Sweating and feeling clammy. Feeling dizzy or light-headed. Being sleepy or having trouble sleeping. Feeling like you may vomit (nauseous). A fast heartbeat. A headache. Blurry vision. Mood changes, such as: Being grouchy. Feeling worried or nervous (anxious). Tingling or loss of feeling (numbness) around your mouth, lips, or tongue. Moderate Confusion and poor judgment. Behavior changes. Weakness. Uneven heartbeat. Trouble with moving (coordination). Very low Very low blood sugar (severe hypoglycemia) is a medical emergency. It can cause: Fainting. Seizures. Loss of consciousness (coma). Death. How is this treated? Treating low blood sugar Low blood sugar is often treated by eating or drinking something that has sugar in it right away. The food or drink should contain 15 grams of a fast-acting carb (carbohydrate). Options include: 4 oz (120 mL) of fruit juice. 4 oz (120 mL) of regular soda (not diet soda). A few pieces of hard candy. Check food labels to see how many pieces to eat for 15 grams. 1 Tbsp (15 mL) of sugar or honey. 4 glucose tablets. 1 tube of glucose gel. Treating low blood sugar if you have diabetes If you can think clearly and swallow safely, follow the 15:15 rule: Take 15 grams of a fast-acting carb. Talk with your doctor about how much you should take. Always keep a source of fast-acting carb with you, such as: Glucose tablets (take 4 tablets). A few pieces of hard candy. Check food labels to see how many pieces to eat for 15 grams. 4 oz (120 mL) of fruit juice. 4 oz (120 mL) of regular soda (not diet soda). 1 Tbsp (15 mL) of honey or sugar. 1 tube of glucose gel. Check your blood sugar 15 minutes after you take the carb. If your blood sugar is still at or below 70 mg/dL (3.9 mmol/L), take 15 grams of a carb again. If your blood sugar does not go above  70 mg/dL (3.9 mmol/L) after 3 tries, get help right away. After your blood sugar goes back to normal, eat a meal or a snack within 1 hour.  Treating very low blood sugar If your blood sugar is below 54 mg/dL (3 mmol/L), you have very low blood sugar, or severe hypoglycemia. This is an emergency. Get medical help right away. If you have very low blood sugar and you cannot eat or drink, you will need to be given a hormone called glucagon. A family member or friend should learn how to check your blood sugar and how to give you glucagon. Ask your doctor if you need to have an emergency glucagon kit at  home. Very low blood sugar may also need to be treated in a hospital. Follow these instructions at home: General instructions Take over-the-counter and prescription medicines only as told by your doctor. Stay aware of your blood sugar as told by your doctor. If you drink alcohol: Limit how much you have to: 0-1 drink a day for women who are not pregnant. 0-2 drinks a day for men. Know how much alcohol is in your drink. In the [place] one drink equals one 12 oz bottle of beer (355 mL), one 5 oz glass of wine (148 mL), or one 1 oz glass of hard liquor (44 mL). Be sure to eat food when you drink alcohol. Know that your body absorbs alcohol quickly. This may lead to low blood sugar later. Be sure to keep checking your blood sugar. Keep all follow-up visits. If you have diabetes:  Always have a fast-acting carb (15 grams) with you to treat low blood sugar. Follow your diabetes care plan as told by your doctor. Make sure you: Know the symptoms of low blood sugar. Check your blood sugar as often as told. Always check it before and after exercise. Always check your blood sugar before you drive. Take your medicines as told. Follow your meal plan. Eat on time. Do not skip meals. Share your diabetes care plan with: Your work or school. People you live with. Carry a card or wear jewelry that says you  have diabetes. Where to find more information American Diabetes Association: www.diabetes.org Contact a doctor if: You have trouble keeping your blood sugar in your target range. You have low blood sugar often. Get help right away if: You still have symptoms after you eat or drink something that contains 15 grams of fast-acting carb, and you cannot get your blood sugar above 70 mg/dL by following the 56:38 rule. Your blood sugar is below 54 mg/dL (3 mmol/L). You have a seizure. You faint. These symptoms may be an emergency. Get help right away. Call your local emergency services (911 in the [place] Do not wait to see if the symptoms will go away. Do not drive yourself to the hospital. Summary Hypoglycemia happens when the level of sugar (glucose) in your blood is too low. Low blood sugar can happen to people who have diabetes and people who do not have diabetes. Low blood sugar can happen quickly, and it can be an emergency. Make sure you know the symptoms of low blood sugar and know how to treat it. Always keep a source of sugar (fast-acting carb) with you to treat low blood sugar. This information is not intended to replace advice given to you by your health care provider. Make sure you discuss any questions you have with your health care provider. Document Revised: 11/19/2019 Document Reviewed: 11/19/2019 Elsevier Patient Education  2024 Elsevier Inc. DASH Eating Plan DASH stands for Dietary Approaches to Stop Hypertension. The DASH eating plan is a healthy eating plan that has been shown to: Lower high blood pressure (hypertension). Reduce your risk for type 2 diabetes, heart disease, and stroke. Help with weight loss. What are tips for following this plan? Reading food labels Check food labels for the amount of salt (sodium) per serving. Choose foods with less than 5 percent of the Daily Value (DV) of sodium. In general, foods with less than 300 milligrams (mg) of sodium per serving  fit into this eating plan. To find whole grains, look for the word "whole" as the first word in the ingredient  list. Shopping Buy products labeled as "low-sodium" or "no salt added." Buy fresh foods. Avoid canned foods and pre-made or frozen meals. Cooking Try not to add salt when you cook. Use salt-free seasonings or herbs instead of table salt or sea salt. Check with your health care provider or pharmacist before using salt substitutes. Do not fry foods. Cook foods in healthy ways, such as baking, boiling, grilling, roasting, or broiling. Cook using oils that are good for your heart. These include olive, canola, avocado, soybean, and sunflower oil. Meal planning  Eat a balanced diet. This should include: 4 or more servings of fruits and 4 or more servings of vegetables each day. Try to fill half of your plate with fruits and vegetables. 6-8 servings of whole grains each day. 6 or less servings of lean meat, poultry, or fish each day. 1 oz is 1 serving. A 3 oz (85 g) serving of meat is about the same size as the palm of your hand. One egg is 1 oz (28 g). 2-3 servings of low-fat dairy each day. One serving is 1 cup (237 mL). 1 serving of nuts, seeds, or beans 5 times each week. 2-3 servings of heart-healthy fats. Healthy fats called omega-3 fatty acids are found in foods such as walnuts, flaxseeds, fortified milks, and eggs. These fats are also found in cold-water fish, such as sardines, salmon, and mackerel. Limit how much you eat of: Canned or prepackaged foods. Food that is high in trans fat, such as fried foods. Food that is high in saturated fat, such as fatty meat. Desserts and other sweets, sugary drinks, and other foods with added sugar. Full-fat dairy products. Do not salt foods before eating. Do not eat more than 4 egg yolks a week. Try to eat at least 2 vegetarian meals a week. Eat more home-cooked food and less restaurant, buffet, and fast food. Lifestyle When eating at a  restaurant, ask if your food can be made with less salt or no salt. If you drink alcohol: Limit how much you have to: 0-1 drink a day if you are male. 0-2 drinks a day if you are male. Know how much alcohol is in your drink. In the [place] one drink is one 12 oz bottle of beer (355 mL), one 5 oz glass of wine (148 mL), or one 1 oz glass of hard liquor (44 mL). General information Avoid eating more than 2,300 mg of salt a day. If you have hypertension, you may need to reduce your sodium intake to 1,500 mg a day. Work with your provider to stay at a healthy body weight or lose weight. Ask what the best weight range is for you. On most days of the week, get at least 30 minutes of exercise that causes your heart to beat faster. This may include walking, swimming, or biking. Work with your provider or dietitian to adjust your eating plan to meet your specific calorie needs. What foods should I eat? Fruits All fresh, dried, or frozen fruit. Canned fruits that are in their natural juice and do not have sugar added to them. Vegetables Fresh or frozen vegetables that are raw, steamed, roasted, or grilled. Low-sodium or reduced-sodium tomato and vegetable juice. Low-sodium or reduced-sodium tomato sauce and tomato paste. Low-sodium or reduced-sodium canned vegetables. Grains Whole-grain or whole-wheat bread. Whole-grain or whole-wheat pasta. Brown rice. Orpah Cobb. Bulgur. Whole-grain and low-sodium cereals. Pita bread. Low-fat, low-sodium crackers. Whole-wheat flour tortillas. Meats and other proteins Skinless chicken or [place]. Ground chicken  or [place]. Pork with fat trimmed off. Fish and seafood. Egg whites. Dried beans, peas, or lentils. Unsalted nuts, nut butters, and seeds. Unsalted canned beans. Lean cuts of beef with fat trimmed off. Low-sodium, lean precooked or cured meat, such as sausages or meat loaves. Dairy Low-fat (1%) or fat-free (skim) milk. Reduced-fat, low-fat, or fat-free  cheeses. Nonfat, low-sodium ricotta or cottage cheese. Low-fat or nonfat yogurt. Low-fat, low-sodium cheese. Fats and oils Soft margarine without trans fats. Vegetable oil. Reduced-fat, low-fat, or light mayonnaise and salad dressings (reduced-sodium). Canola, safflower, olive, avocado, soybean, and sunflower oils. Avocado. Seasonings and condiments Herbs. Spices. Seasoning mixes without salt. Other foods Unsalted popcorn and pretzels. Fat-free sweets. The items listed above may not be all the foods and drinks you can have. Talk to a dietitian to learn more. What foods should I avoid? Fruits Canned fruit in a light or heavy syrup. Fried fruit. Fruit in cream or butter sauce. Vegetables Creamed or fried vegetables. Vegetables in a cheese sauce. Regular canned vegetables that are not marked as low-sodium or reduced-sodium. Regular canned tomato sauce and paste that are not marked as low-sodium or reduced-sodium. Regular tomato and vegetable juices that are not marked as low-sodium or reduced-sodium. Rosita Fire. Olives. Grains Baked goods made with fat, such as croissants, muffins, or some breads. Dry pasta or rice meal packs. Meats and other proteins Fatty cuts of meat. Ribs. Fried meat. Tomasa Blase. Bologna, salami, and other precooked or cured meats, such as sausages or meat loaves, that are not lean and low in sodium. Fat from the back of a pig (fatback). Bratwurst. Salted nuts and seeds. Canned beans with added salt. Canned or smoked fish. Whole eggs or egg yolks. Chicken or [place] with skin. Dairy Whole or 2% milk, cream, and half-and-half. Whole or full-fat cream cheese. Whole-fat or sweetened yogurt. Full-fat cheese. Nondairy creamers. Whipped toppings. Processed cheese and cheese spreads. Fats and oils Butter. Stick margarine. Lard. Shortening. Ghee. Bacon fat. Tropical oils, such as coconut, palm kernel, or palm oil. Seasonings and condiments Onion salt, garlic salt, seasoned salt, table salt,  and sea salt. Worcestershire sauce. Tartar sauce. Barbecue sauce. Teriyaki sauce. Soy sauce, including reduced-sodium soy sauce. Steak sauce. Canned and packaged gravies. Fish sauce. Oyster sauce. Cocktail sauce. Store-bought horseradish. Ketchup. Mustard. Meat flavorings and tenderizers. Bouillon cubes. Hot sauces. Pre-made or packaged marinades. Pre-made or packaged taco seasonings. Relishes. Regular salad dressings. Other foods Salted popcorn and pretzels. The items listed above may not be all the foods and drinks you should avoid. Talk to a dietitian to learn more. Where to find more information National Heart, Lung, and Blood Institute (NHLBI): BuffaloDryCleaner.gl American Heart Association (AHA): heart.org Academy of Nutrition and Dietetics: eatright.org National Kidney Foundation (NKF): kidney.org This information is not intended to replace advice given to you by your health care provider. Make sure you discuss any questions you have with your health care provider. Document Revised: 01/04/2022 Document Reviewed: 01/04/2022 Elsevier Patient Education  2024 [employer].

## 2022-09-04 NOTE — Addendum Note (Signed)
Addended by: Shyheem Whitham, Huntley Dec E on: 09/04/2022 08:28 AM   Modules accepted: Orders

## 2022-09-05 ENCOUNTER — Other Ambulatory Visit: Payer: Self-pay | Admitting: Pharmacist

## 2022-09-05 ENCOUNTER — Other Ambulatory Visit (HOSPITAL_BASED_OUTPATIENT_CLINIC_OR_DEPARTMENT_OTHER): Payer: Self-pay

## 2022-09-05 ENCOUNTER — Other Ambulatory Visit: Payer: Self-pay

## 2022-09-05 ENCOUNTER — Ambulatory Visit: Payer: Self-pay | Admitting: Licensed Clinical Social Worker

## 2022-09-05 ENCOUNTER — Other Ambulatory Visit: Payer: Self-pay | Admitting: Nurse Practitioner

## 2022-09-05 DIAGNOSIS — E1159 Type 2 diabetes mellitus with other circulatory complications: Secondary | ICD-10-CM

## 2022-09-05 DIAGNOSIS — G4733 Obstructive sleep apnea (adult) (pediatric): Secondary | ICD-10-CM

## 2022-09-05 DIAGNOSIS — E1169 Type 2 diabetes mellitus with other specified complication: Secondary | ICD-10-CM

## 2022-09-05 MED ORDER — ONETOUCH ULTRA VI STRP
1.0000 | ORAL_STRIP | Freq: Three times a day (TID) | 0 refills | Status: AC
  Filled 2022-09-05: qty 1, 30d supply, fill #0
  Filled 2022-09-17: qty 100, 25d supply, fill #0

## 2022-09-05 MED ORDER — ONETOUCH ULTRA MINI W/DEVICE KIT
1.0000 | PACK | Freq: Three times a day (TID) | 0 refills | Status: AC
  Filled 2022-09-05: qty 1, 30d supply, fill #0

## 2022-09-05 MED ORDER — ONETOUCH DELICA PLUS LANCET33G MISC
1.0000 | Freq: Three times a day (TID) | 0 refills | Status: AC
  Filled 2022-09-05: qty 100, 30d supply, fill #0

## 2022-09-05 MED ORDER — ONETOUCH VERIO VI STRP
1.0000 | ORAL_STRIP | Freq: Three times a day (TID) | 0 refills | Status: AC
  Filled 2022-09-05: qty 100, 30d supply, fill #0

## 2022-09-05 NOTE — Progress Notes (Signed)
Sleep study referral placed for patient. Contacted by Care Management team and informed patient has a history of OSA with testing many years ago. At that time devices were not sufficiently large enough to fit his face. I recommend repeat evaluation and consideration for nasal pillows or alternative airflow device that would appropriately fit his face for management.

## 2022-09-05 NOTE — Progress Notes (Signed)
Care Coordination Call  Outreached patient in response to inbasket message from case manager requesting test strips.   Collaborated with primary provider to send glucometer + supplies to his pharmacy.   Briefly discussed hypertension - full initial outreach by pharmacist to occur on 09/13/22. Patient was aware of this upcoming phone visit, and had no additional urgent needs or questions at this time.   Lynnda Shields, PharmD, BCPS Clinical Pharmacist [facility]

## 2022-09-06 NOTE — Patient Outreach (Signed)
  Care Coordination   Initial Visit Note   09/05/2022 Name: Matthew Wiggins MRN: [id number] DOB: 04-10-1985  Matthew Wiggins is a 37 y.o. year old male who sees Early, Sung Amabile, NP for primary care. I spoke with  Matthew Wiggins by phone today.  What matters to the patients health and wellness today?  Supportive resources    Goals Addressed             This Visit's Progress    LCSW-Obtain Supporive Resources   On track    Activities and task to complete in order to accomplish goals.   Keep all upcoming appointments discussed today Continue with compliance of taking medication prescribed by Doctor Implement healthy coping skills discussed to assist with management of symptoms Continue working with [facility] care team to assist with goals identified          SDOH assessments and interventions completed:  No     Care Coordination Interventions:  Yes, provided  Interventions Today    Flowsheet Row Most Recent Value  Chronic Disease   Chronic disease during today's visit Diabetes, Hypertension (HTN)  General Interventions   General Interventions Discussed/Reviewed General Interventions Discussed, Doctor Visits, Community Resources  Doctor Visits Discussed/Reviewed Doctor Visits Discussed  Mental Health Interventions   Mental Health Discussed/Reviewed Mental Health Discussed, Coping Strategies  Nutrition Interventions   Nutrition Discussed/Reviewed Nutrition Discussed  Pharmacy Interventions   Pharmacy Dicussed/Reviewed Pharmacy Topics Discussed, Medication Adherence  Safety Interventions   Safety Discussed/Reviewed Safety Discussed        Follow up plan: Follow up call scheduled for 2-4 weeks    Encounter Outcome:  Patient Visit Completed   Jenel Lucks, MSW, LCSW [facility] Care Management [facility]  [facility] [email or web address] Phone [phone] 11:14 PM

## 2022-09-06 NOTE — Patient Instructions (Signed)
Visit Information  Thank you for taking time to visit with me today. Please don't hesitate to contact me if I can be of assistance to you.   Following are the goals we discussed today:   Goals Addressed             This Visit's Progress    LCSW-Obtain Supporive Resources   On track    Activities and task to complete in order to accomplish goals.   Keep all upcoming appointments discussed today Continue with compliance of taking medication prescribed by Doctor Implement healthy coping skills discussed to assist with management of symptoms Continue working with [facility] care team to assist with goals identified          Our next appointment is by telephone on 09/18 at 1 PM  Please call the care guide team at [phone] if you need to cancel or reschedule your appointment.   If you are experiencing a Mental Health or Behavioral Health Crisis or need someone to talk to, please call the Suicide and Crisis Lifeline: 988 call 911   The patient verbalized understanding of instructions, educational materials, and care plan provided today and DECLINED offer to receive copy of patient instructions, educational materials, and care plan.   Jenel Lucks, MSW, LCSW [facility] Care Management [place]  [facility] [email or web address] Phone [phone] 11:15 PM

## 2022-09-13 ENCOUNTER — Other Ambulatory Visit: Payer: Self-pay | Admitting: Pharmacist

## 2022-09-13 ENCOUNTER — Other Ambulatory Visit (HOSPITAL_BASED_OUTPATIENT_CLINIC_OR_DEPARTMENT_OTHER): Payer: Self-pay

## 2022-09-13 NOTE — Progress Notes (Signed)
   09/13/2022 Name: WOFFORD BILLOCK MRN: [id number] DOB: [birth year]   Noralee Chars Seyller is a 37 y.o. year old male who presented for a telephone visit.   They were referred to the pharmacist by their Case Management Team  for assistance in managing diabetes, hypertension, and hyperlipidemia.   S/O: Applied for [employer], "not granted because patient does not have a child and is not working" per mother's report who provides today's history.  Mother was on the way to pickup test strips at time of my call, expressed a cost burden preventing them from filling the prescription sooner. Wanted to reschedule a repeat call for more in depth review of blood glucose, and wants to arrange BP cuff. Challenge with obtaining cuff that is proper fit for patients body habitus.  A/P: - Collaborated with [facility] leader to investigate XL cuff size for patient, ongoing - Will investigate cost of prescriptions/confirm insurance coverage, evaluate for cost reduction opportunities   Follow Up Plan: 1 week to review BG values, facilitate BP cuff for home monitoring  Lynnda Shields, PharmD, BCPS Clinical Pharmacist [facility]

## 2022-09-17 ENCOUNTER — Other Ambulatory Visit (HOSPITAL_BASED_OUTPATIENT_CLINIC_OR_DEPARTMENT_OTHER): Payer: Self-pay

## 2022-09-19 ENCOUNTER — Encounter: Payer: Self-pay | Admitting: Licensed Clinical Social Worker

## 2022-09-20 ENCOUNTER — Other Ambulatory Visit: Payer: Self-pay

## 2022-09-20 ENCOUNTER — Telehealth: Payer: Self-pay | Admitting: Licensed Clinical Social Worker

## 2022-09-20 NOTE — Progress Notes (Unsigned)
09/20/2022 Name: Matthew Wiggins MRN: [id number] DOB: [birth year]  No chief complaint on file.   Matthew Wiggins is a 37 y.o. year old male who presented for a telephone visit.   They were referred to the pharmacist by {referredtopharmacy:27270} for assistance in managing {referralreason:27271}.    Subjective:  Care Team: Primary Care Provider: Tollie Eth, NP ; Next Scheduled Visit: 10/11/22  Medication Access/Adherence  Current Pharmacy:  [facility] [address] [place] [address] Phone: [phone] Fax: [phone]   Patient reports affordability concerns with their medications: {YES/NO:21197} Patient reports access/transportation concerns to their pharmacy: {YES/NO:21197} Patient reports adherence concerns with their medications:  {YES/NO:21197} ***   Diabetes:  Current medications:  Medications tried in the past: Metformin, Lantus, Basaglar Currently on: Levemir  Current glucose readings: *** Using *** meter; testing *** times daily   Patient {Actions; denies-reports:120008} hypoglycemic s/sx including ***dizziness, shakiness, sweating. Patient {Actions; denies-reports:120008} hyperglycemic symptoms including ***polyuria, polydipsia, polyphagia, nocturia, neuropathy, blurred vision.  Current meal patterns:  - Breakfast: *** - Lunch *** - Supper *** - Snacks *** - Drinks ***  Current physical activity: ***  Current medication access support: *** Hypertension:  Current medications: Edarbi, once daily Medications previously tried: Lisinopril/HCTZ  Patient {HAS/DOES NOT ONGE:95284} a validated, automated, upper arm home BP cuff Current blood pressure readings readings: ***  Patient {Actions; denies-reports:120008} hypotensive s/sx including ***dizziness, lightheadedness.  Patient {Actions; denies-reports:120008} hypertensive symptoms including ***headache, chest pain, shortness of  breath  Current meal patterns: ***  Current physical activity: ***   Objective:  Lab Results  Component Value Date   HGBA1C 12.4 (H) 08/28/2022    Lab Results  Component Value Date   CREATININE 0.77 08/28/2022   BUN 9 08/28/2022   NA 135 08/28/2022   K 4.8 08/28/2022   CL 97 08/28/2022   CO2 24 08/28/2022    Lab Results  Component Value Date   CHOL 164 08/28/2022   HDL 36 (L) 08/28/2022   LDLCALC 112 (H) 08/28/2022   TRIG 82 08/28/2022   CHOLHDL 4.6 08/28/2022    Medications Reviewed Today   Medications were not reviewed in this encounter       Assessment/Plan:   Diabetes: - Currently {CHL Controlled/[phone]} - Reviewed long term cardiovascular and renal outcomes of uncontrolled blood sugar - Reviewed goal A1c, goal fasting, and goal 2 hour post prandial glucose - Reviewed dietary modifications including *** - Reviewed lifestyle modifications including: - Recommend to ***  - Recommend to check glucose *** - Meets financial criteria for *** patient assistance program through ***. Will collaborate with provider, CPhT, and patient to pursue assistance.      Hypertension: - Currently {CHL Controlled/[phone]} - Reviewed long term cardiovascular and renal outcomes of uncontrolled blood pressure - Reviewed appropriate blood pressure monitoring technique and reviewed goal blood pressure. Recommended to check home blood pressure and heart rate *** - Recommend to ***     Follow Up Plan: ***    Sherrill Raring, PharmD Clinical Pharmacist [phone]

## 2022-09-20 NOTE — Patient Outreach (Signed)
Care Coordination   09/19/2022 Name: Matthew Wiggins MRN: [id number] DOB: Mar 10, 1985   Care Coordination Outreach Attempts:  An unsuccessful telephone outreach was attempted for a scheduled appointment today.  Follow Up Plan:  Additional outreach attempts will be made to offer the patient care coordination information and services.   Encounter Outcome:  No Answer   Care Coordination Interventions:  No, not indicated    Jenel Lucks, MSW, LCSW [facility] Care Management [place]  [facility] [email or web address] Phone [phone] 4:19 PM

## 2022-09-21 NOTE — Patient Instructions (Signed)
Visit Information  Thank you for taking time to visit with me today. Please don't hesitate to contact me if I can be of assistance to you.   Following are the goals we discussed today:   Goals Addressed             This Visit's Progress    LCSW-Obtain Supporive Resources   On track    Activities and task to complete in order to accomplish goals.   Keep all upcoming appointments discussed today Continue with compliance of taking medication prescribed by Doctor Implement healthy coping skills discussed to assist with management of symptoms Continue working with [facility] care team to assist with goals identified          Our next appointment is by telephone on 09/30 at 1 PM  Please call the care guide team at [phone] if you need to cancel or reschedule your appointment.   If you are experiencing a Mental Health or Behavioral Health Crisis or need someone to talk to, please call the Suicide and Crisis Lifeline: 988 call 911   The patient verbalized understanding of instructions, educational materials, and care plan provided today and DECLINED offer to receive copy of patient instructions, educational materials, and care plan.   Jenel Lucks, MSW, LCSW [facility] Care Management [place]  [facility] [email or web address] Phone [phone] 4:37 AM

## 2022-09-21 NOTE — Patient Outreach (Signed)
Care Coordination   Follow Up Visit Note   09/20/2022 Name: Matthew Wiggins MRN: [id number] DOB: 07-02-1985  Matthew Wiggins is a 37 y.o. year old male who sees Early, Sung Amabile, NP for primary care. I spoke with  Matthew Chars Traum's mom by phone today.  What matters to the patients health and wellness today?  Supportive Resources, Upcoming appts    Goals Addressed             This Visit's Progress    LCSW-Obtain Supporive Resources   On track    Activities and task to complete in order to accomplish goals.   Keep all upcoming appointments discussed today Continue with compliance of taking medication prescribed by Doctor Implement healthy coping skills discussed to assist with management of symptoms Continue working with [facility] care team to assist with goals identified          SDOH assessments and interventions completed:  No     Care Coordination Interventions:  Yes, provided  Interventions Today    Flowsheet Row Most Recent Value  Chronic Disease   Chronic disease during today's visit Diabetes, Hypertension (HTN)  General Interventions   General Interventions Discussed/Reviewed General Interventions Reviewed, Doctor Visits, Community Resources  Doctor Visits Discussed/Reviewed Doctor Visits Reviewed  Mental Health Interventions   Mental Health Discussed/Reviewed Mental Health Reviewed  Nutrition Interventions   Nutrition Discussed/Reviewed Nutrition Reviewed  Pharmacy Interventions   Pharmacy Dicussed/Reviewed Pharmacy Topics Reviewed, Medication Adherence       Follow up plan: Follow up call scheduled for 2-4 weeks    Encounter Outcome:  Patient Visit Completed   Jenel Lucks, MSW, LCSW [facility] Care Management [facility]  [facility] [email or web address] Phone [phone] 4:37 AM

## 2022-09-26 ENCOUNTER — Telehealth: Payer: Self-pay | Admitting: Licensed Clinical Social Worker

## 2022-09-26 NOTE — Patient Outreach (Signed)
Care Coordination   Follow Up Visit Note   09/26/2022 Name: Matthew Wiggins MRN: [id number] DOB: 1985/03/06  Matthew Wiggins is a 37 y.o. year old male who sees Early, Sung Amabile, NP for primary care. I  mailed supportive resources to patient, per request  What matters to the patients health and wellness today?  Supportive Resources    Goals Addressed             This Visit's Progress    LCSW-Obtain Supporive Resources   On track    Activities and task to complete in order to accomplish goals.   Keep all upcoming appointments discussed today Continue with compliance of taking medication prescribed by Doctor Implement healthy coping skills discussed to assist with management of symptoms Continue working with [facility] care team to assist with goals identified Review supportive resources provided on Out of the [employer], [employer] plans for [place], [facility], and [employer] transportation application          SDOH assessments and interventions completed:  No     Care Coordination Interventions:  Yes, provided  Interventions Today    Flowsheet Row Most Recent Value  Chronic Disease   Chronic disease during today's visit Diabetes, Hypertension (HTN)  General Interventions   General Interventions Discussed/Reviewed [facility]       Follow up plan: Follow up call scheduled for 1-2 weeks    Encounter Outcome:  Patient Visit Completed   Jenel Lucks, MSW, LCSW [facility] Care Management [facility]  [facility] [email or web address] Phone [phone] 5:38 PM

## 2022-10-01 ENCOUNTER — Ambulatory Visit: Payer: Self-pay | Admitting: Licensed Clinical Social Worker

## 2022-10-01 NOTE — Patient Outreach (Signed)
Care Coordination   Follow Up Visit Note   10/01/2022 Name: Matthew Wiggins MRN: [id number] DOB: [birth year]  Matthew Wiggins is a 37 y.o. year old male who sees Early, Sung Amabile, NP for primary care. I spoke with  Matthew Wiggins by phone today.  What matters to the patients health and wellness today?  Prescription needs    Goals Addressed             This Visit's Progress    LCSW-Obtain Supporive Resources   On track    Activities and task to complete in order to accomplish goals.   Keep all upcoming appointments discussed today Continue with compliance of taking medication prescribed by Doctor Implement healthy coping skills discussed to assist with management of symptoms Continue working with [facility] care team to assist with goals identified Review supportive resources provided on Out of the [employer], [employer] plans for [place], Vocational Rehab, and [employer] transportation application          SDOH assessments and interventions completed:  No     Care Coordination Interventions:  Yes, provided  Interventions Today    Flowsheet Row Most Recent Value  Chronic Disease   Chronic disease during today's visit Diabetes, Hypertension (HTN)  General Interventions   General Interventions Discussed/Reviewed General Interventions Reviewed, [facility], Doctor Visits, Communication with  Matthew Wiggins has yet to receive resources in the mail, agreed to check this week. patient is doing well,  however, notes he has been unable to administer insulin due to needing needles]  Doctor Visits Discussed/Reviewed Doctor Visits Reviewed  Communication with PCP/Specialists, Pharmacists  [Pt's pharmacy has yet to receive prescription for needles to administer insulin.]  Mental Health Interventions   Mental Health Discussed/Reviewed Mental Health Reviewed  Pharmacy Interventions   Pharmacy Dicussed/Reviewed Pharmacy Topics Reviewed, Medication Adherence  [Med costs have  decreased since speaking with pharmacist this month]       Follow up plan: Follow up call scheduled for 2-4 weeks    Encounter Outcome:  Patient Visit Completed   Jenel Lucks, MSW, LCSW [facility] Care Management [facility]  [facility] [email or web address] Phone [phone] 3:48 PM

## 2022-10-01 NOTE — Patient Instructions (Signed)
Visit Information  Thank you for taking time to visit with me today. Please don't hesitate to contact me if I can be of assistance to you.   Following are the goals we discussed today:   Goals Addressed             This Visit's Progress    LCSW-Obtain Supporive Resources   On track    Activities and task to complete in order to accomplish goals.   Keep all upcoming appointments discussed today Continue with compliance of taking medication prescribed by Doctor Implement healthy coping skills discussed to assist with management of symptoms Continue working with [facility] care team to assist with goals identified Review supportive resources provided on Out of the [employer], Managed [employer] for [place], Vocational Rehab, and [employer] transportation application          Our next appointment is by telephone on 11/04 at 1 PM  Please call the care guide team at [phone] if you need to cancel or reschedule your appointment.   If you are experiencing a Mental Health or Behavioral Health Crisis or need someone to talk to, please call the Suicide and Crisis Lifeline: 988 call 911   The patient verbalized understanding of instructions, educational materials, and care plan provided today and DECLINED offer to receive copy of patient instructions, educational materials, and care plan.   Jenel Lucks, MSW, LCSW [facility] Care Management [place]  [facility] [email or web address] Phone [phone] 3:50 PM

## 2022-10-02 ENCOUNTER — Other Ambulatory Visit (HOSPITAL_BASED_OUTPATIENT_CLINIC_OR_DEPARTMENT_OTHER): Payer: Self-pay

## 2022-10-02 ENCOUNTER — Telehealth: Payer: Self-pay

## 2022-10-02 ENCOUNTER — Other Ambulatory Visit: Payer: Self-pay

## 2022-10-02 ENCOUNTER — Institutional Professional Consult (permissible substitution): Payer: Commercial Managed Care - HMO | Admitting: Neurology

## 2022-10-02 MED ORDER — INSULIN PEN NEEDLE 31G X 8 MM MISC
1.0000 | Freq: Every day | 1 refills | Status: DC
  Filled 2022-10-02: qty 100, 30d supply, fill #0

## 2022-10-02 NOTE — Progress Notes (Signed)
10/02/2022  Patient ID: Matthew Wiggins, male   DOB: Jan 05, 1985, 37 y.o.   MRN: [id number]  Attempted to contact patient to notify pen needles had been sent to pharmacy, and to r/s his missed appt from 9/19. Left HIPAA compliant message for patient to return my call at their convenience.   Sherrill Raring, PharmD Clinical Pharmacist [phone]

## 2022-10-03 ENCOUNTER — Other Ambulatory Visit: Payer: Self-pay

## 2022-10-03 NOTE — Patient Outreach (Signed)
Care Management   Visit Note  10/03/2022 Name: Matthew Wiggins MRN: [id number] DOB: [birth year]  Subjective: Matthew Wiggins is a 37 y.o. year old male who is a primary care patient of Early, Sung Amabile, NP. The Care Management team was consulted for assistance.      Engaged with patient spoke with patient by telephone.    Goals Addressed             This Visit's Progress    RNCM Care Management for Effective Management of DM, HTN, and Obesity       Current Barriers:  Knowledge Deficits related to plan of care for management of HTN, DMII, and Obesity  Care Coordination needs related to Limited access to food and Lacks knowledge of community resource: for effective management of obesity, HTN, DM and mental health needs   Chronic Disease Management support and education needs related to HTN, DMII, and Obesity Financial Constraints.  Difficulty obtaining medications  RNCM Clinical Goal(s):  Patient will verbalize basic understanding of HTN, DMII, and Obesity disease process and self health management plan as evidenced by following the plan of care, blood pressures WNL, Blood sugars trending down and stable, A1C,7, healthy weight loss, and establishing positive health and wellness goals for effective management of chronic conditions take all medications exactly as prescribed and will call provider for medication related questions as evidenced by compliance with medications and calling the pcp and pharm D for changes or new needs related to medication needs.     demonstrate improved and ongoing adherence to prescribed treatment plan for HTN, DMII, and Obesity as evidenced by normalized blood pressures and blood sugars, trending downward A1C, and healthy weight loss work with pharmacist to address Financial constraints related to medication cost and having no insurance and Medication procurement related to HTN, DMII, and Obesity as evidenced by review of EMR and patient or pharmacist report     work with Child psychotherapist to address Financial constraints related to food resources, eye exam and glasses, and unemployement, Limited access to food, and Obesity resources related to the management of HTN, DMII, and Obesity as evidenced by review of EMR and patient or Child psychotherapist report     work with Publishing rights manager care guide to address needs related to Limited access to food and resources for eye exam and glasses as evidenced by patient and/or community resource care guide support    demonstrate ongoing self health care management ability for effective management of HTN, DM, and Obesity as evidenced by     through collaboration with Medical illustrator, provider, and care team.   Interventions: Evaluation of current treatment plan related to  self management and patient's adherence to plan as established by provider   Diabetes:  (Status: Goal on Track (progressing): YES.) Long Term Goal   Lab Results  Component Value Date   HGBA1C 12.4 (H) 08/28/2022   Assessed patient's understanding of A1c goal: <7%. Review of wanting the A1C to come down gradually. The patient is working with the CCM team to effectively manage his health and well being. The patient knows that he needs to get his blood sugars into range to prevent complications and problems with other chronic conditions.  Provided education to patient about basic DM disease process. Review of goals of care and will attach information in the AVS for the patient. The patient has been sent a link via his phone number so he can set up MyChart; Reviewed medications with patient and discussed  importance of medication adherence. The patient has the meter for checking blood sugars and is checking his blood sugars on a regular basis. The patient is also doing his insulin coverage per the instructions of his pcp. He will follow up with the pcp next week. Ask the patient to take his readings and how much insulin he is taking.         Reviewed prescribed  diet with patient heart healthy/ADA diet- sent planning healthy meals by email and also gave information on the ADA website for review and assistance; Counseled on importance of regular laboratory monitoring as prescribed;        Discussed plans with patient for ongoing care management follow up and provided patient with direct contact information for care management team;      Provided patient with written educational materials related to hypo and hyperglycemia and importance of correct treatment. Sent via email information on hypo and hyperglycemia. The patient denies any acute highs or acute lows. Review of the sx and sx of hyper and hypoglycemia. The patient denies any issues at this time. ;       Reviewed scheduled/upcoming provider appointments including: 10-11-2022. Encouraged the patient to take his glucose readings with him and insulin log. Also to write down any questions the patient has he wishes to address with the pcp.;         Advised patient, providing education and rationale, to check cbg 4 times daily, when you have symptoms of low or high blood sugar, and before and after exercise and record. The patient does not have strips and ask for help with getting strips for his meter. Education provided.    The patient has been having readings of 250 and more at times. He has seen some as low as 190. Education provided that the goal of fasting is <130 and post prandial <180. Did review the notes of the pcp and she wants his blood sugars at 150 of less. Education and support given.      call provider for findings outside established parameters;       Referral made to pharmacy team for assistance with for assistance with medication cost, medication reconciliation, and help with options for DM management and HTN management. Works with the pharm D on a regular basis.;       Referral made to social work team for assistance with food resources, cost effective eye care, and other needs due to the patient  being unemployed. Works with the SW on a regular basis. ;      Review of patient status, including review of consultants reports, relevant laboratory and other test results, and medications completed;       Encourage the patient to increase activity as tolerated and that doing 15 minutes of moderate exercise can assist in lowering his blood sugars. Advised patient to discuss call the office for acute changes in his blood sugars, questions and concerns with provider;      Screening for signs and symptoms of depression related to chronic disease state;        Assessed social determinant of health barriers;         Hypertension: (Status: Goal on Track (progressing): YES.) Long Term Goal  Last practice recorded BP readings:  BP Readings from Last 3 Encounters:  08/28/22 (!) 120/100  08/29/21 (!) 117/108  10/17/20 136/86   Most recent eGFR/CrCl:  Lab Results  Component Value Date   EGFR 118 08/28/2022    No components found  for: "CRCL"  Evaluation of current treatment plan related to hypertension self management and patient's adherence to plan as established by provider;   Provided education to patient re: stroke prevention, s/s of heart attack and stroke; Reviewed prescribed diet heart healthy/ADA diet. The patient is monitoring his dietary intake. Reviewed medications with patient and discussed importance of compliance. States compliance with medications and also works with pharm D on a regular basis. Questions ask about continuing to take Vitamin D and advised the patient to continue as instructed by his provider. Has upcoming pcp visit next week and encouraged him to write down questions to ask the pcp.;  Counseled on the importance of exercise goals with target of 150 minutes per week Discussed plans with patient for ongoing care management follow up and provided patient with direct contact information for care management team; Advised patient, providing education and rationale, to monitor  blood pressure daily and record, calling PCP for findings outside established parameters;  Reviewed scheduled/upcoming provider appointments including: 10-11-2022 at 315 pm Advised patient to discuss healthy weight loss program, sleep apnea concerns, questions and concerns about heart health with provider; Provided education on prescribed diet heart Healthy/ADA diet. Will send the patient information about heart healthy/ADA diet ;  Discussed complications of poorly controlled blood pressure such as heart disease, stroke, circulatory complications, vision complications, kidney impairment, sexual dysfunction;  Screening for signs and symptoms of depression related to chronic disease state;  Assessed social determinant of health barriers;   Weight Loss:  (Status: Goal on Track (progressing): YES.)  Advised patient to discuss with primary care provider options regarding weight management;  Provided verbal and/or written education to patient re: provider recommended life style modifications. Education provided on exercise and how it can improve overall health and well being. ;  Screening for signs and symptoms of depression. The patients mother states she can see a positive difference in the patient and his mood and mental health well being. ;  Offered to connect patient with psychology or social work support for counseling and supportive care;  Reviewed recommended dietary changes: avoid fad diets, make small/incremental dietary and exercise changes, eat at the table and avoid eating in front of the TV, plan management of cravings, monitor snacking and cravings in food diary; Advised patient to discuss healthy weight loss options  with provider; Assessed social determinant of health barriers;   Patient Goals/Self-Care Activities: Take medications as prescribed   Attend all scheduled provider appointments Call pharmacy for medication refills 3-7 days in advance of running out of medications Perform  all self care activities independently  Perform IADL's (shopping, preparing meals, housekeeping, managing finances) independently Call provider office for new concerns or questions  Work with the social worker to address care coordination needs and will continue to work with the clinical team to address health care and disease management related needs Work with the pharmacist to address medication management needs and will continue to work with the clinical team to address health care and disease management related needs call the Suicide and Crisis Lifeline: 988 call the [place] National Suicide Prevention Lifeline: 639-846-5574 or TTY: 639-375-1544 TTY 430-112-2715) to talk to a trained counselor call 1-800-273-TALK (toll free, 24 hour hotline) go to [facility] [address] [place] [phone]) if experiencing a Mental Health or Behavioral Health Crisis  schedule appointment with eye doctor check blood sugar at prescribed times: 4 times daily, when you have symptoms of low or high blood sugar, and before  and after exercise check feet daily for cuts, sores or redness enter blood sugar readings and medication or insulin into daily log take the blood sugar log to all doctor visits trim toenails straight across drink 6 to 8 glasses of water each day eat fish at least once per week fill half of plate with vegetables join a weight loss program limit fast food meals to no more than 1 per week manage portion size prepare main meal at home 3 to 5 days each week read food labels for fat, fiber, carbohydrates and portion size reduce red meat to 2 to 3 times a week set a realistic goal switch to sugar-free drinks keep feet up while sitting wash and dry feet carefully every day wear comfortable, cotton socks wear comfortable, well-fitting shoes check blood pressure weekly choose a place to take my blood pressure (home, clinic or office, retail  store) learn about high blood pressure call doctor for signs and symptoms of high blood pressure develop an action plan for high blood pressure keep all doctor appointments take medications for blood pressure exactly as prescribed begin an exercise program report new symptoms to your doctor eat more whole grains, fruits and vegetables, lean meats and healthy fats            Consent to Services:  Patient was given information about care management services, agreed to services, and gave verbal consent to participate.   Plan: Telephone follow up appointment with care management team member scheduled for: 12-12-2022 at 1 pm  Alto Denver RN, MSN, CCM RN Care Manager  [facility]  Ambulatory Care Management  Direct Number: [phone]

## 2022-10-03 NOTE — Patient Instructions (Signed)
Visit Information  Thank you for taking time to visit with me today. Please don't hesitate to contact me if I can be of assistance to you before our next scheduled telephone appointment.  Following are the goals we discussed today:   Goals Addressed             This Visit's Progress    RNCM Care Management for Effective Management of DM, HTN, and Obesity       Current Barriers:  Knowledge Deficits related to plan of care for management of HTN, DMII, and Obesity  Care Coordination needs related to Limited access to food and Lacks knowledge of community resource: for effective management of obesity, HTN, DM and mental health needs   Chronic Disease Management support and education needs related to HTN, DMII, and Obesity Financial Constraints.  Difficulty obtaining medications  RNCM Clinical Goal(s):  Patient will verbalize basic understanding of HTN, DMII, and Obesity disease process and self health management plan as evidenced by following the plan of care, blood pressures WNL, Blood sugars trending down and stable, A1C,7, healthy weight loss, and establishing positive health and wellness goals for effective management of chronic conditions take all medications exactly as prescribed and will call provider for medication related questions as evidenced by compliance with medications and calling the pcp and pharm D for changes or new needs related to medication needs.     demonstrate improved and ongoing adherence to prescribed treatment plan for HTN, DMII, and Obesity as evidenced by normalized blood pressures and blood sugars, trending downward A1C, and healthy weight loss work with pharmacist to address Financial constraints related to medication cost and having no insurance and Medication procurement related to HTN, DMII, and Obesity as evidenced by review of EMR and patient or pharmacist report    work with Child psychotherapist to address Financial constraints related to food resources, eye exam  and glasses, and unemployement, Limited access to food, and Obesity resources related to the management of HTN, DMII, and Obesity as evidenced by review of EMR and patient or Child psychotherapist report     work with Publishing rights manager care guide to address needs related to Limited access to food and resources for eye exam and glasses as evidenced by patient and/or community resource care guide support    demonstrate ongoing self health care management ability for effective management of HTN, DM, and Obesity as evidenced by     through collaboration with Medical illustrator, provider, and care team.   Interventions: Evaluation of current treatment plan related to  self management and patient's adherence to plan as established by provider   Diabetes:  (Status: Goal on Track (progressing): YES.) Long Term Goal   Lab Results  Component Value Date   HGBA1C 12.4 (H) 08/28/2022   Assessed patient's understanding of A1c goal: <7%. Review of wanting the A1C to come down gradually. The patient is working with the CCM team to effectively manage his health and well being. The patient knows that he needs to get his blood sugars into range to prevent complications and problems with other chronic conditions.  Provided education to patient about basic DM disease process. Review of goals of care and will attach information in the AVS for the patient. The patient has been sent a link via his phone number so he can set up MyChart; Reviewed medications with patient and discussed importance of medication adherence. The patient has the meter for checking blood sugars and is checking his blood sugars on  a regular basis. The patient is also doing his insulin coverage per the instructions of his pcp. He will follow up with the pcp next week. Ask the patient to take his readings and how much insulin he is taking.         Reviewed prescribed diet with patient heart healthy/ADA diet- sent planning healthy meals by email and also gave  information on the ADA website for review and assistance; Counseled on importance of regular laboratory monitoring as prescribed;        Discussed plans with patient for ongoing care management follow up and provided patient with direct contact information for care management team;      Provided patient with written educational materials related to hypo and hyperglycemia and importance of correct treatment. Sent via email information on hypo and hyperglycemia. The patient denies any acute highs or acute lows. Review of the sx and sx of hyper and hypoglycemia. The patient denies any issues at this time. ;       Reviewed scheduled/upcoming provider appointments including: 10-11-2022. Encouraged the patient to take his glucose readings with him and insulin log. Also to write down any questions the patient has he wishes to address with the pcp.;         Advised patient, providing education and rationale, to check cbg 4 times daily, when you have symptoms of low or high blood sugar, and before and after exercise and record. The patient does not have strips and ask for help with getting strips for his meter. Education provided.    The patient has been having readings of 250 and more at times. He has seen some as low as 190. Education provided that the goal of fasting is <130 and post prandial <180. Did review the notes of the pcp and she wants his blood sugars at 150 of less. Education and support given.      call provider for findings outside established parameters;       Referral made to pharmacy team for assistance with for assistance with medication cost, medication reconciliation, and help with options for DM management and HTN management. Works with the pharm D on a regular basis.;       Referral made to social work team for assistance with food resources, cost effective eye care, and other needs due to the patient being unemployed. Works with the SW on a regular basis. ;      Review of patient status,  including review of consultants reports, relevant laboratory and other test results, and medications completed;       Encourage the patient to increase activity as tolerated and that doing 15 minutes of moderate exercise can assist in lowering his blood sugars. Advised patient to discuss call the office for acute changes in his blood sugars, questions and concerns with provider;      Screening for signs and symptoms of depression related to chronic disease state;        Assessed social determinant of health barriers;         Hypertension: (Status: Goal on Track (progressing): YES.) Long Term Goal  Last practice recorded BP readings:  BP Readings from Last 3 Encounters:  08/28/22 (!) 120/100  08/29/21 (!) 117/108  10/17/20 136/86   Most recent eGFR/CrCl:  Lab Results  Component Value Date   EGFR 118 08/28/2022    No components found for: "CRCL"  Evaluation of current treatment plan related to hypertension self management and patient's adherence to plan as established  by provider;   Provided education to patient re: stroke prevention, s/s of heart attack and stroke; Reviewed prescribed diet heart healthy/ADA diet. The patient is monitoring his dietary intake. Reviewed medications with patient and discussed importance of compliance. States compliance with medications and also works with pharm D on a regular basis. Questions ask about continuing to take Vitamin D and advised the patient to continue as instructed by his provider. Has upcoming pcp visit next week and encouraged him to write down questions to ask the pcp.;  Counseled on the importance of exercise goals with target of 150 minutes per week Discussed plans with patient for ongoing care management follow up and provided patient with direct contact information for care management team; Advised patient, providing education and rationale, to monitor blood pressure daily and record, calling PCP for findings outside established parameters;   Reviewed scheduled/upcoming provider appointments including: 10-11-2022 at 315 pm Advised patient to discuss healthy weight loss program, sleep apnea concerns, questions and concerns about heart health with provider; Provided education on prescribed diet heart Healthy/ADA diet. Will send the patient information about heart healthy/ADA diet ;  Discussed complications of poorly controlled blood pressure such as heart disease, stroke, circulatory complications, vision complications, kidney impairment, sexual dysfunction;  Screening for signs and symptoms of depression related to chronic disease state;  Assessed social determinant of health barriers;   Weight Loss:  (Status: Goal on Track (progressing): YES.)  Advised patient to discuss with primary care provider options regarding weight management;  Provided verbal and/or written education to patient re: provider recommended life style modifications. Education provided on exercise and how it can improve overall health and well being. ;  Screening for signs and symptoms of depression. The patients mother states she can see a positive difference in the patient and his mood and mental health well being. ;  Offered to connect patient with psychology or social work support for counseling and supportive care;  Reviewed recommended dietary changes: avoid fad diets, make small/incremental dietary and exercise changes, eat at the table and avoid eating in front of the TV, plan management of cravings, monitor snacking and cravings in food diary; Advised patient to discuss healthy weight loss options  with provider; Assessed social determinant of health barriers;   Patient Goals/Self-Care Activities: Take medications as prescribed   Attend all scheduled provider appointments Call pharmacy for medication refills 3-7 days in advance of running out of medications Perform all self care activities independently  Perform IADL's (shopping, preparing meals,  housekeeping, managing finances) independently Call provider office for new concerns or questions  Work with the social worker to address care coordination needs and will continue to work with the clinical team to address health care and disease management related needs Work with the pharmacist to address medication management needs and will continue to work with the clinical team to address health care and disease management related needs call the Suicide and Crisis Lifeline: 988 call the [place] National Suicide Prevention Lifeline: 414-879-4816 or TTY: 513-711-0106 TTY (778) 840-6254) to talk to a trained counselor call 1-800-273-TALK (toll free, 24 hour hotline) go to [facility] [address] [place] [phone]) if experiencing a Mental Health or Behavioral Health Crisis  schedule appointment with eye doctor check blood sugar at prescribed times: 4 times daily, when you have symptoms of low or high blood sugar, and before and after exercise check feet daily for cuts, sores or redness enter blood sugar readings and medication or insulin into  daily log take the blood sugar log to all doctor visits trim toenails straight across drink 6 to 8 glasses of water each day eat fish at least once per week fill half of plate with vegetables join a weight loss program limit fast food meals to no more than 1 per week manage portion size prepare main meal at home 3 to 5 days each week read food labels for fat, fiber, carbohydrates and portion size reduce red meat to 2 to 3 times a week set a realistic goal switch to sugar-free drinks keep feet up while sitting wash and dry feet carefully every day wear comfortable, cotton socks wear comfortable, well-fitting shoes check blood pressure weekly choose a place to take my blood pressure (home, clinic or office, retail store) learn about high blood pressure call doctor for signs and symptoms of high blood  pressure develop an action plan for high blood pressure keep all doctor appointments take medications for blood pressure exactly as prescribed begin an exercise program report new symptoms to your doctor eat more whole grains, fruits and vegetables, lean meats and healthy fats           Our next appointment is by telephone on 12-12-2022 at 1 pm  Please call the care guide team at [phone] if you need to cancel or reschedule your appointment.   If you are experiencing a Mental Health or Behavioral Health Crisis or need someone to talk to, please call the Suicide and Crisis Lifeline: 988 call the [place] National Suicide Prevention Lifeline: (316) 425-3767 or TTY: 813-732-9486 TTY 703-149-3090) to talk to a trained counselor call 1-800-273-TALK (toll free, 24 hour hotline)   The patient verbalized understanding of instructions, educational materials, and care plan provided today and agreed to receive a mailed copy of patient instructions, educational materials, and care plan. Information sent to the patient to sign up for MyChart today.   Telephone follow up appointment with care management team member scheduled for: 12-12-2022 at 1 pm  Alto Denver RN, MSN, CCM RN Care Manager  [facility]  Ambulatory Care Management  Direct Number: [phone]      Exercising to Lose Weight Getting regular exercise is important for everyone. It is especially important if you are overweight. Being overweight increases your risk of heart disease, stroke, diabetes, high blood pressure, and several types of cancer. Exercising, and reducing the calories you consume, can help you lose weight and improve fitness and health. Exercise can be moderate or vigorous intensity. To lose weight, most people need to do a certain amount of moderate or vigorous-intensity exercise each week. How can exercise affect me? You lose weight when you exercise enough to burn more calories than you eat. Exercise also reduces  body fat and builds muscle. The more muscle you have, the more calories you burn. Exercise also: Improves mood. Reduces stress and tension. Improves your overall fitness, flexibility, and endurance. Increases bone strength. Moderate-intensity exercise  Moderate-intensity exercise is any activity that gets you moving enough to burn at least three times more energy (calories) than if you were sitting. Examples of moderate exercise include: Walking a mile in 15 minutes. Doing light yard work. Biking at an easy pace. Most people should get at least 150 minutes of moderate-intensity exercise a week to maintain their body weight. Vigorous-intensity exercise Vigorous-intensity exercise is any activity that gets you moving enough to burn at least six times more calories than if you were sitting. When you exercise at this intensity, you should be  working hard enough that you are not able to carry on a conversation. Examples of vigorous exercise include: Running. Playing a team sport, such as football, basketball, and soccer. Jumping rope. Most people should get at least 75 minutes a week of vigorous exercise to maintain their body weight. What actions can I take to lose weight? The amount of exercise you need to lose weight depends on: Your age. The type of exercise. Any health conditions you have. Your overall physical ability. Talk to your health care provider about how much exercise you need and what types of activities are safe for you. Nutrition  Make changes to your diet as told by your health care provider or diet and nutrition specialist (dietitian). This may include: Eating fewer calories. Eating more protein. Eating less unhealthy fats. Eating a diet that includes fresh fruits and vegetables, whole grains, low-fat dairy products, and lean protein. Avoiding foods with added fat, salt, and sugar. Drink plenty of water while you exercise to prevent dehydration or heat  stroke. Activity Choose an activity that you enjoy and set realistic goals. Your health care provider can help you make an exercise plan that works for you. Exercise at a moderate or vigorous intensity most days of the week. The intensity of exercise may vary from person to person. You can tell how intense a workout is for you by paying attention to your breathing and heartbeat. Most people will notice their breathing and heartbeat get faster with more intense exercise. Do resistance training twice each week, such as: Push-ups. Sit-ups. Lifting weights. Using resistance bands. Getting short amounts of exercise can be just as helpful as long, structured periods of exercise. If you have trouble finding time to exercise, try doing these things as part of your daily routine: Get up, stretch, and walk around every 30 minutes throughout the day. Go for a walk during your lunch break. Park your car farther away from your destination. If you take public transportation, get off one stop early and walk the rest of the way. Make phone calls while standing up and walking around. Take the stairs instead of elevators or escalators. Wear comfortable clothes and shoes with good support. Do not exercise so much that you hurt yourself, feel dizzy, or get very short of breath. Where to find more information [place] Department of Health and Human Services: ThisPath.fi Centers for Disease Control and Prevention: FootballExhibition.com.br Contact a health care provider: Before starting a new exercise program. If you have questions or concerns about your weight. If you have a medical problem that keeps you from exercising. Get help right away if: You have any of the following while exercising: Injury. Dizziness. Difficulty breathing or shortness of breath that does not go away when you stop exercising. Chest pain. Rapid heartbeat. These symptoms may represent a serious problem that is an emergency. Do not wait to see if  the symptoms will go away. Get medical help right away. Call your local emergency services (911 in the [place] Do not drive yourself to the hospital. Summary Getting regular exercise is especially important if you are overweight. Being overweight increases your risk of heart disease, stroke, diabetes, high blood pressure, and several types of cancer. Losing weight happens when you burn more calories than you eat. Reducing the amount of calories you eat, and getting regular moderate or vigorous exercise each week, helps you lose weight. This information is not intended to replace advice given to you by your health care provider. Make sure you  discuss any questions you have with your health care provider. Document Revised: 02/14/2020 Document Reviewed: 02/14/2020 Elsevier Patient Education  2024 [employer]. Vitamin D Deficiency Vitamin D deficiency is when your body does not have enough vitamin D. Vitamin D is important because: It helps your body use certain minerals. It helps to keep your bones healthy. It lessens irritation and swelling (inflammation). It helps the body's defense system (immune system) work better. Not getting enough vitamin D can make your bones soft. What are the causes? Not eating enough foods that have vitamin D in them. Not getting enough sun. Having diseases that make it hard for your body to take in vitamin D. Having had part of your stomach or part of your small intestine taken out. What increases the risk? Being an older adult. Not spending much time outdoors. Living in a long-term care center. Having dark skin. Taking certain medicines. Being overweight or very overweight (obese). Having long-term (chronic) kidney or liver disease. What are the signs or symptoms? In mild cases, there may be no symptoms. If the condition is very bad, symptoms may include: Bone pain. Muscle pain. Not being able to walk normally. Bones that break easily. Joint pain. How  is this treated? Treatment may include taking supplements as told by your doctor. Your doctor will tell you what dose is best for you. This may include taking: Vitamin D. Calcium. Follow these instructions at home: Eating and drinking Eat foods that have vitamin D in them, such as: Dairy products, cereals, or juices that have vitamin D added to them (are fortified). Check the label. Fish, such as salmon or trout. Eggs. The vitamin D is in the yolk. Mushrooms that were treated with UV light. Beef liver. The items listed above may not be a complete list of foods and beverages you can eat and drink. Contact a dietitian for more information. General instructions Take over-the-counter and prescription medicines only as told by your doctor. Take supplements only as told by your doctor. Get sunlight in a safe way. Do not use a tanning bed. Stay at a healthy weight. Lose weight if you need to. Keep all follow-up visits. How is this prevented? Eating foods that naturally have vitamin D in them. Eating or drinking foods and drinks that have vitamin D added to them, such as cereals, juices, and milk. Taking vitamin D or a multivitamin that has vitamin D in it. Being in the sun. Your body makes vitamin D when your skin gets sunlight. Contact a doctor if: Your symptoms do not go away. You feel like you may vomit (nauseous). You vomit. You poop less often than normal, or you have trouble pooping (constipation). Summary Vitamin D deficiency is when your body does not have enough vitamin D. Vitamin D helps to keep your bones healthy. This condition is often treated by taking a supplement. Your doctor will tell you what dose is best for you. This information is not intended to replace advice given to you by your health care provider. Make sure you discuss any questions you have with your health care provider. Document Revised: 09/23/2020 Document Reviewed: 09/23/2020 Elsevier Patient Education   2024 Elsevier Inc. Diabetes Mellitus and Exercise Regular exercise is important for your health, especially if you have diabetes mellitus. Exercise is not just about losing weight. It can also help you increase muscle strength and bone density and reduce body fat and stress. This can help your level of endurance and make you more fit and flexible.  Why should I exercise if I have diabetes? Exercise has many benefits for people with diabetes. It can: Help lower and control your blood sugar (glucose). Help your body respond better and become more sensitive to the hormone insulin. Reduce how much insulin your body needs. Lower your risk for heart disease by: Lowering how much "bad" cholesterol and triglycerides you have in your body. Increasing how much "good" cholesterol you have in your body. Lowering your blood pressure. Lowering your blood glucose levels. What is my activity plan? Your health care provider or an expert trained in diabetes care (certified diabetes educator) can help you make an activity plan. This plan can help you find the type of exercise that works for you. It may also tell you how often to exercise and for how long. Be sure to: Get at least 150 minutes of medium-intensity or high-intensity exercise each week. This may involve brisk walking, biking, or water aerobics. Do stretching and strengthening exercises at least 2 times a week. This may involve yoga or weight lifting. Spread out your activity over at least 3 days of the week. Get some form of physical activity each day. Do not go more than 2 days in a row without some kind of activity. Avoid being inactive for more than 30 minutes at a time. Take frequent breaks to walk or stretch. Choose activities that you enjoy. Set goals that you know you can accomplish. Start slowly and increase the intensity of your exercise over time. How do I manage my diabetes during exercise?  Monitor your blood glucose Check your blood  glucose before and after you exercise. If your blood glucose is 240 mg/dL (16.1 mmol/L) or higher before you exercise, check your urine for ketones. These are chemicals created by the liver. If you have ketones in your urine, do not exercise until your blood glucose returns to normal. If your blood glucose is 100 mg/dL (5.6 mmol/L) or lower, eat a snack that has 15-20 grams of carbohydrate in it. Check your blood glucose 15 minutes after the snack to make sure that your level is above 100 mg/dL (5.6 mmol/L) before you start to exercise. Your risk for low blood glucose (hypoglycemia) goes up during and after exercise. Know the symptoms of this condition and how to treat it. Follow these instructions at home: Keep a carbohydrate snack on hand for use before, during, and after exercise. This can help prevent or treat hypoglycemia. Avoid injecting insulin into parts of your body that are going to be used during exercise. This may include: Your arms, when you are going to play tennis. Your legs, when you are about to go jogging. Keep track of your exercise habits. This can help you and your health care provider watch and adjust your activity plan. Write down: What you eat before and after you exercise. Blood glucose levels before and after you exercise. The type and amount of exercise you do. Talk to your health care provider before you start a new activity. They may need to: Make sure that the activity is safe for you. Adjust your insulin, other medicines, and food that you eat. Drink water while you exercise. This can stop you from losing too much water (dehydration). It can also prevent problems caused by having a lot of heat in your body (heat stroke). Where to find more information American Diabetes Association: diabetes.org Association of Diabetes [employer] & Education Specialists: diabeteseducator.org This information is not intended to replace advice given to you by your  health care provider. Make  sure you discuss any questions you have with your health care provider. Document Revised: 06/07/2021 Document Reviewed: 06/07/2021 Elsevier Patient Education  2024 [employer] Hyperglycemia Hyperglycemia is when the sugar (glucose) level in your blood is too high. High blood sugar can happen to people who have or do not have diabetes. High blood sugar can happen quickly. It can be an emergency. What are the causes? If you have diabetes, high blood sugar may be caused by: Medicines that increase blood sugar or affect your control of diabetes. Getting less physical activity. Overeating. Being sick or injured or having an infection. Having surgery. Stress. Not giving yourself enough insulin (if you are taking it). You may have high blood sugar because you have diabetes that has not been diagnosed yet. If you do not have diabetes, high blood sugar may be caused by: Certain medicines. Stress. A bad illness. An infection. Having surgery. Diseases of the pancreas. What increases the risk? This condition is more likely to develop in people who have risk factors for diabetes, such as: Having a family member with diabetes. Certain conditions in which the body's defense system (immune system) attacks itself. These are called autoimmune disorders. Being overweight. Not being active. Having a condition called insulin resistance. Having a history of: Prediabetes. Diabetes when pregnant. Polycystic ovarian syndrome (PCOS). What are the signs or symptoms? This condition may not cause symptoms. If you do have symptoms, they may include: Feeling more thirsty than normal. Needing to pee (urinate) more often than normal. Hunger. Feeling very tired. Blurry eyesight (vision). You may get other symptoms as the condition gets worse, such as: Dry mouth. Pain in your belly (abdomen). Not being hungry (loss of appetite). Breath that smells fruity. Weakness. Weight loss that is not planned. A  tingling or numb feeling in your hands or feet. A headache. Cuts or bruises that heal slowly. How is this treated? Treatment depends on the cause of your condition. Treatment may include: Taking medicine to control your blood sugar levels. Changing your medicine or dosage if you take insulin or other diabetes medicines. Lifestyle changes. These may include: Exercising more. Eating healthier foods. Losing weight. Treating an illness or infection. Checking your blood sugar more often. Stopping or reducing steroid medicines. If your condition gets very bad, you will need to be treated in the hospital. Follow these instructions at home: General instructions Take over-the-counter and prescription medicines only as told by your doctor. Do not smoke or use any products that contain nicotine or tobacco. If you need help quitting, ask your doctor. If you drink alcohol: Limit how much you have to: 0-1 drink a day for women who are not pregnant. 0-2 drinks a day for men. Know how much alcohol is in a drink. In the [place] one drink equals one 12 oz bottle of beer (355 mL), one 5 oz glass of wine (148 mL), or one 1 oz glass of hard liquor (44 mL). Manage stress. If you need help with this, ask your doctor. Do exercises as told by your doctor. Keep all follow-up visits. Eating and drinking  Stay at a healthy weight. Make sure you drink enough fluid when you: Exercise. Get sick. Are in hot temperatures. Drink enough fluid to keep your pee (urine) pale yellow. If you have diabetes:  Know the symptoms of high blood sugar. Follow your diabetes management plan as told by your doctor. Make sure you: Take insulin and medicines as told. Follow your exercise  plan. Follow your meal plan. Eat on time. Do not skip meals. Check your blood sugar as often as told. Make sure you check before and after exercise. If you exercise longer or in a different way, check your blood sugar more often. Follow your  sick day plan whenever you cannot eat or drink normally. Make this plan ahead of time with your doctor. Share your diabetes management plan with people in your workplace, school, and household. Check your pee for ketones when you are ill and as told by your doctor. Carry a card or wear jewelry that says that you have diabetes. Where to find more information American Diabetes Association: www.diabetes.org Contact a doctor if: Your blood sugar level is at or above 240 mg/dL (16.1 mmol/L) for 2 days in a row. You have problems keeping your blood sugar in your target range. You have high blood pressure often. You have signs of illness, such as: Feeling like you may vomit (feeling nauseous). Vomiting. A fever. Get help right away if: Your blood sugar monitor reads "high" even when you are taking insulin. You have trouble breathing. You have a change in how you think, feel, or act (mental status). You feel like you may vomit, and the feeling does not go away. You cannot stop vomiting. These symptoms may be an emergency. Get medical help right away. Call your local emergency services (911 in the [place] Do not wait to see if the symptoms will go away. Do not drive yourself to the hospital. Summary Hyperglycemia is when the sugar (glucose) level in your blood is too high. High blood sugar can happen to people who have or do not have diabetes. Make sure you drink enough fluids and follow your meal plan. Exercise as often as told by your doctor. Contact your doctor if you have problems keeping your blood sugar in your target range. This information is not intended to replace advice given to you by your health care provider. Make sure you discuss any questions you have with your health care provider. Document Revised: 10/01/2019 Document Reviewed: 10/02/2019 Elsevier Patient Education  2024 [employer] Hypoglycemia Hypoglycemia is when the sugar (glucose) level in your blood is too low. Low  blood sugar can happen to people who have diabetes and people who do not have diabetes. Low blood sugar can happen quickly, and it can be an emergency. What are the causes? This condition happens most often in people who have diabetes. It may be caused by: Diabetes medicine. Not eating enough, or not eating often enough. Doing more physical activity. Drinking alcohol on an empty stomach. If you do not have diabetes, this condition may be caused by: A tumor in the pancreas. Not eating enough, or not eating for long periods at a time (fasting). A very bad infection or illness. Problems after having weight loss (bariatric) surgery. Kidney failure or liver failure. Certain medicines. What increases the risk? This condition is more likely to develop in people who: Have diabetes and take medicines to lower their blood sugar. Abuse alcohol. Have a very bad illness. What are the signs or symptoms? Mild Hunger. Sweating and feeling clammy. Feeling dizzy or light-headed. Being sleepy or having trouble sleeping. Feeling like you may vomit (nauseous). A fast heartbeat. A headache. Blurry vision. Mood changes, such as: Being grouchy. Feeling worried or nervous (anxious). Tingling or loss of feeling (numbness) around your mouth, lips, or tongue. Moderate Confusion and poor judgment. Behavior changes. Weakness. Uneven heartbeat. Trouble with moving (coordination). Very  low Very low blood sugar (severe hypoglycemia) is a medical emergency. It can cause: Fainting. Seizures. Loss of consciousness (coma). Death. How is this treated? Treating low blood sugar Low blood sugar is often treated by eating or drinking something that has sugar in it right away. The food or drink should contain 15 grams of a fast-acting carb (carbohydrate). Options include: 4 oz (120 mL) of fruit juice. 4 oz (120 mL) of regular soda (not diet soda). A few pieces of hard candy. Check food labels to see how many  pieces to eat for 15 grams. 1 Tbsp (15 mL) of sugar or honey. 4 glucose tablets. 1 tube of glucose gel. Treating low blood sugar if you have diabetes If you can think clearly and swallow safely, follow the 15:15 rule: Take 15 grams of a fast-acting carb. Talk with your doctor about how much you should take. Always keep a source of fast-acting carb with you, such as: Glucose tablets (take 4 tablets). A few pieces of hard candy. Check food labels to see how many pieces to eat for 15 grams. 4 oz (120 mL) of fruit juice. 4 oz (120 mL) of regular soda (not diet soda). 1 Tbsp (15 mL) of honey or sugar. 1 tube of glucose gel. Check your blood sugar 15 minutes after you take the carb. If your blood sugar is still at or below 70 mg/dL (3.9 mmol/L), take 15 grams of a carb again. If your blood sugar does not go above 70 mg/dL (3.9 mmol/L) after 3 tries, get help right away. After your blood sugar goes back to normal, eat a meal or a snack within 1 hour.  Treating very low blood sugar If your blood sugar is below 54 mg/dL (3 mmol/L), you have very low blood sugar, or severe hypoglycemia. This is an emergency. Get medical help right away. If you have very low blood sugar and you cannot eat or drink, you will need to be given a hormone called glucagon. A family member or friend should learn how to check your blood sugar and how to give you glucagon. Ask your doctor if you need to have an emergency glucagon kit at home. Very low blood sugar may also need to be treated in a hospital. Follow these instructions at home: General instructions Take over-the-counter and prescription medicines only as told by your doctor. Stay aware of your blood sugar as told by your doctor. If you drink alcohol: Limit how much you have to: 0-1 drink a day for women who are not pregnant. 0-2 drinks a day for men. Know how much alcohol is in your drink. In the [place] one drink equals one 12 oz bottle of beer (355 mL), one 5  oz glass of wine (148 mL), or one 1 oz glass of hard liquor (44 mL). Be sure to eat food when you drink alcohol. Know that your body absorbs alcohol quickly. This may lead to low blood sugar later. Be sure to keep checking your blood sugar. Keep all follow-up visits. If you have diabetes:  Always have a fast-acting carb (15 grams) with you to treat low blood sugar. Follow your diabetes care plan as told by your doctor. Make sure you: Know the symptoms of low blood sugar. Check your blood sugar as often as told. Always check it before and after exercise. Always check your blood sugar before you drive. Take your medicines as told. Follow your meal plan. Eat on time. Do not skip meals. Share your diabetes  care plan with: Your work or school. People you live with. Carry a card or wear jewelry that says you have diabetes. Where to find more information American Diabetes Association: www.diabetes.org Contact a doctor if: You have trouble keeping your blood sugar in your target range. You have low blood sugar often. Get help right away if: You still have symptoms after you eat or drink something that contains 15 grams of fast-acting carb, and you cannot get your blood sugar above 70 mg/dL by following the 65:78 rule. Your blood sugar is below 54 mg/dL (3 mmol/L). You have a seizure. You faint. These symptoms may be an emergency. Get help right away. Call your local emergency services (911 in the [place] Do not wait to see if the symptoms will go away. Do not drive yourself to the hospital. Summary Hypoglycemia happens when the level of sugar (glucose) in your blood is too low. Low blood sugar can happen to people who have diabetes and people who do not have diabetes. Low blood sugar can happen quickly, and it can be an emergency. Make sure you know the symptoms of low blood sugar and know how to treat it. Always keep a source of sugar (fast-acting carb) with you to treat low blood  sugar. This information is not intended to replace advice given to you by your health care provider. Make sure you discuss any questions you have with your health care provider. Document Revised: 11/19/2019 Document Reviewed: 11/19/2019 Elsevier Patient Education  2024 [employer].

## 2022-10-04 ENCOUNTER — Other Ambulatory Visit: Payer: Self-pay

## 2022-10-04 ENCOUNTER — Other Ambulatory Visit (HOSPITAL_BASED_OUTPATIENT_CLINIC_OR_DEPARTMENT_OTHER): Payer: Self-pay

## 2022-10-04 MED ORDER — INSULIN PEN NEEDLE 32G X 4 MM MISC
1.0000 | Freq: Every day | 1 refills | Status: AC
  Filled 2022-10-04: qty 100, fill #0
  Filled 2022-10-11: qty 100, 30d supply, fill #0

## 2022-10-04 NOTE — Patient Outreach (Signed)
Care Management   Visit Note  10/04/2022 Name: Matthew Wiggins MRN: [id number] DOB: [birth year]  Subjective: Matthew Wiggins is a 37 y.o. year old male who is a primary care patient of Early, Sung Amabile, NP. The Care Management team was consulted for assistance.      Engaged with patient spoke with the family member (POA, [place], [place]).    Goals Addressed             This Visit's Progress    RNCM Care Management for Effective Management of DM, HTN, and Obesity       Current Barriers:  Knowledge Deficits related to plan of care for management of HTN, DMII, and Obesity  Care Coordination needs related to Limited access to food and Lacks knowledge of community resource: for effective management of obesity, HTN, DM and mental health needs   Chronic Disease Management support and education needs related to HTN, DMII, and Obesity Financial Constraints.  Difficulty obtaining medications  RNCM Clinical Goal(s):  Patient will verbalize basic understanding of HTN, DMII, and Obesity disease process and self health management plan as evidenced by following the plan of care, blood pressures WNL, Blood sugars trending down and stable, A1C,7, healthy weight loss, and establishing positive health and wellness goals for effective management of chronic conditions take all medications exactly as prescribed and will call provider for medication related questions as evidenced by compliance with medications and calling the pcp and pharm D for changes or new needs related to medication needs.     demonstrate improved and ongoing adherence to prescribed treatment plan for HTN, DMII, and Obesity as evidenced by normalized blood pressures and blood sugars, trending downward A1C, and healthy weight loss work with pharmacist to address Financial constraints related to medication cost and having no insurance and Medication procurement related to HTN, DMII, and Obesity as evidenced by review of EMR and patient or  pharmacist report    work with Child psychotherapist to address Financial constraints related to food resources, eye exam and glasses, and unemployement, Limited access to food, and Obesity resources related to the management of HTN, DMII, and Obesity as evidenced by review of EMR and patient or Child psychotherapist report     work with Publishing rights manager care guide to address needs related to Limited access to food and resources for eye exam and glasses as evidenced by patient and/or community resource care guide support    demonstrate ongoing self health care management ability for effective management of HTN, DM, and Obesity as evidenced by     through collaboration with Medical illustrator, provider, and care team.   Interventions: Evaluation of current treatment plan related to  self management and patient's adherence to plan as established by provider   Diabetes:  (Status: Goal on Track (progressing): YES.) Long Term Goal   Lab Results  Component Value Date   HGBA1C 12.4 (H) 08/28/2022   Assessed patient's understanding of A1c goal: <7%. Review of wanting the A1C to come down gradually. The patient is working with the CCM team to effectively manage his health and well being. The patient knows that he needs to get his blood sugars into range to prevent complications and problems with other chronic conditions.  Provided education to patient about basic DM disease process. Review of goals of care and will attach information in the AVS for the patient. The patient has been sent a link via his phone number so he can set up MyChart; Reviewed medications with  patient and discussed importance of medication adherence. The patient has the meter for checking blood sugars and is checking his blood sugars on a regular basis. The patient is also doing his insulin coverage per the instructions of his pcp. He will follow up with the pcp next week. Ask the patient to take his readings and how much insulin he is taking. Incoming  call from the patients mother, and DPR asking for assistance with needles. They picked up the order but the needles do not fit. They need the 4mm x 32 gauge Nano pen needles. Secure in basket message sent to the pcp, pharm D and staff asking for assistance with a new RX for he correct size needles the patient needs. Will continue to monitor for changes.        Reviewed prescribed diet with patient heart healthy/ADA diet- sent planning healthy meals by email and also gave information on the ADA website for review and assistance; Counseled on importance of regular laboratory monitoring as prescribed;        Discussed plans with patient for ongoing care management follow up and provided patient with direct contact information for care management team;      Provided patient with written educational materials related to hypo and hyperglycemia and importance of correct treatment. Sent via email information on hypo and hyperglycemia. The patient denies any acute highs or acute lows. Review of the sx and sx of hyper and hypoglycemia. The patient denies any issues at this time. ;       Reviewed scheduled/upcoming provider appointments including: 10-11-2022. Encouraged the patient to take his glucose readings with him and insulin log. Also to write down any questions the patient has he wishes to address with the pcp.;         Advised patient, providing education and rationale, to check cbg 4 times daily, when you have symptoms of low or high blood sugar, and before and after exercise and record. The patient does not have strips and ask for help with getting strips for his meter. Education provided.    The patient has been having readings of 250 and more at times. He has seen some as low as 190. Education provided that the goal of fasting is <130 and post prandial <180. Did review the notes of the pcp and she wants his blood sugars at 150 of less. Education and support given.      call provider for findings outside  established parameters;       Referral made to pharmacy team for assistance with for assistance with medication cost, medication reconciliation, and help with options for DM management and HTN management. Works with the pharm D on a regular basis.;       Referral made to social work team for assistance with food resources, cost effective eye care, and other needs due to the patient being unemployed. Works with the SW on a regular basis. ;      Review of patient status, including review of consultants reports, relevant laboratory and other test results, and medications completed;       Encourage the patient to increase activity as tolerated and that doing 15 minutes of moderate exercise can assist in lowering his blood sugars. Advised patient to discuss call the office for acute changes in his blood sugars, questions and concerns with provider;      Screening for signs and symptoms of depression related to chronic disease state;        Assessed social  determinant of health barriers;         Hypertension: (Status: Goal on Track (progressing): YES.) Long Term Goal  Last practice recorded BP readings:  BP Readings from Last 3 Encounters:  08/28/22 (!) 120/100  08/29/21 (!) 117/108  10/17/20 136/86   Most recent eGFR/CrCl:  Lab Results  Component Value Date   EGFR 118 08/28/2022    No components found for: "CRCL"  Evaluation of current treatment plan related to hypertension self management and patient's adherence to plan as established by provider;   Provided education to patient re: stroke prevention, s/s of heart attack and stroke; Reviewed prescribed diet heart healthy/ADA diet. The patient is monitoring his dietary intake. Reviewed medications with patient and discussed importance of compliance. States compliance with medications and also works with pharm D on a regular basis. Questions ask about continuing to take Vitamin D and advised the patient to continue as instructed by his provider.  Has upcoming pcp visit next week and encouraged him to write down questions to ask the pcp.;  Counseled on the importance of exercise goals with target of 150 minutes per week Discussed plans with patient for ongoing care management follow up and provided patient with direct contact information for care management team; Advised patient, providing education and rationale, to monitor blood pressure daily and record, calling PCP for findings outside established parameters;  Reviewed scheduled/upcoming provider appointments including: 10-11-2022 at 315 pm Advised patient to discuss healthy weight loss program, sleep apnea concerns, questions and concerns about heart health with provider; Provided education on prescribed diet heart Healthy/ADA diet. Will send the patient information about heart healthy/ADA diet ;  Discussed complications of poorly controlled blood pressure such as heart disease, stroke, circulatory complications, vision complications, kidney impairment, sexual dysfunction;  Screening for signs and symptoms of depression related to chronic disease state;  Assessed social determinant of health barriers;   Weight Loss:  (Status: Goal on Track (progressing): YES.)  Advised patient to discuss with primary care provider options regarding weight management;  Provided verbal and/or written education to patient re: provider recommended life style modifications. Education provided on exercise and how it can improve overall health and well being. ;  Screening for signs and symptoms of depression. The patients mother states she can see a positive difference in the patient and his mood and mental health well being. ;  Offered to connect patient with psychology or social work support for counseling and supportive care;  Reviewed recommended dietary changes: avoid fad diets, make small/incremental dietary and exercise changes, eat at the table and avoid eating in front of the TV, plan management of  cravings, monitor snacking and cravings in food diary; Advised patient to discuss healthy weight loss options  with provider; Assessed social determinant of health barriers;   Patient Goals/Self-Care Activities: Take medications as prescribed   Attend all scheduled provider appointments Call pharmacy for medication refills 3-7 days in advance of running out of medications Perform all self care activities independently  Perform IADL's (shopping, preparing meals, housekeeping, managing finances) independently Call provider office for new concerns or questions  Work with the social worker to address care coordination needs and will continue to work with the clinical team to address health care and disease management related needs Work with the pharmacist to address medication management needs and will continue to work with the clinical team to address health care and disease management related needs call the Suicide and Crisis Lifeline: 988 call the [place] National Suicide Prevention  Lifeline: 289 044 5648 or TTY: 214-551-8016 TTY 253-204-7073) to talk to a trained counselor call 1-800-273-TALK (toll free, 24 hour hotline) go to [facility] [address], [place] [phone]) if experiencing a Mental Health or Behavioral Health Crisis  schedule appointment with eye doctor check blood sugar at prescribed times: 4 times daily, when you have symptoms of low or high blood sugar, and before and after exercise check feet daily for cuts, sores or redness enter blood sugar readings and medication or insulin into daily log take the blood sugar log to all doctor visits trim toenails straight across drink 6 to 8 glasses of water each day eat fish at least once per week fill half of plate with vegetables join a weight loss program limit fast food meals to no more than 1 per week manage portion size prepare main meal at home 3 to 5 days each week read food  labels for fat, fiber, carbohydrates and portion size reduce red meat to 2 to 3 times a week set a realistic goal switch to sugar-free drinks keep feet up while sitting wash and dry feet carefully every day wear comfortable, cotton socks wear comfortable, well-fitting shoes check blood pressure weekly choose a place to take my blood pressure (home, clinic or office, retail store) learn about high blood pressure call doctor for signs and symptoms of high blood pressure develop an action plan for high blood pressure keep all doctor appointments take medications for blood pressure exactly as prescribed begin an exercise program report new symptoms to your doctor eat more whole grains, fruits and vegetables, lean meats and healthy fats           Consent to Services:  Patient was given information about care management services, agreed to services, and gave verbal consent to participate.   Plan: Telephone follow up appointment with care management team member scheduled for: 12-12-2022 at 1 pm  Alto Denver RN, MSN, CCM RN Care Manager  [facility]  Ambulatory Care Management  Direct Number: [phone]

## 2022-10-04 NOTE — Patient Instructions (Signed)
Visit Information  Thank you for taking time to visit with me today. Please don't hesitate to contact me if I can be of assistance to you before our next scheduled telephone appointment.   Goals Addressed             This Visit's Progress    RNCM Care Management for Effective Management of DM, HTN, and Obesity       Current Barriers:  Knowledge Deficits related to plan of care for management of HTN, DMII, and Obesity  Care Coordination needs related to Limited access to food and Lacks knowledge of community resource: for effective management of obesity, HTN, DM and mental health needs   Chronic Disease Management support and education needs related to HTN, DMII, and Obesity Financial Constraints.  Difficulty obtaining medications  RNCM Clinical Goal(s):  Patient will verbalize basic understanding of HTN, DMII, and Obesity disease process and self health management plan as evidenced by following the plan of care, blood pressures WNL, Blood sugars trending down and stable, A1C,7, healthy weight loss, and establishing positive health and wellness goals for effective management of chronic conditions take all medications exactly as prescribed and will call provider for medication related questions as evidenced by compliance with medications and calling the pcp and pharm D for changes or new needs related to medication needs.     demonstrate improved and ongoing adherence to prescribed treatment plan for HTN, DMII, and Obesity as evidenced by normalized blood pressures and blood sugars, trending downward A1C, and healthy weight loss work with pharmacist to address Financial constraints related to medication cost and having no insurance and Medication procurement related to HTN, DMII, and Obesity as evidenced by review of EMR and patient or pharmacist report    work with Child psychotherapist to address Financial constraints related to food resources, eye exam and glasses, and unemployement, Limited access  to food, and Obesity resources related to the management of HTN, DMII, and Obesity as evidenced by review of EMR and patient or Child psychotherapist report     work with Publishing rights manager care guide to address needs related to Limited access to food and resources for eye exam and glasses as evidenced by patient and/or community resource care guide support    demonstrate ongoing self health care management ability for effective management of HTN, DM, and Obesity as evidenced by     through collaboration with Medical illustrator, provider, and care team.   Interventions: Evaluation of current treatment plan related to  self management and patient's adherence to plan as established by provider   Diabetes:  (Status: Goal on Track (progressing): YES.) Long Term Goal   Lab Results  Component Value Date   HGBA1C 12.4 (H) 08/28/2022   Assessed patient's understanding of A1c goal: <7%. Review of wanting the A1C to come down gradually. The patient is working with the CCM team to effectively manage his health and well being. The patient knows that he needs to get his blood sugars into range to prevent complications and problems with other chronic conditions.  Provided education to patient about basic DM disease process. Review of goals of care and will attach information in the AVS for the patient. The patient has been sent a link via his phone number so he can set up MyChart; Reviewed medications with patient and discussed importance of medication adherence. The patient has the meter for checking blood sugars and is checking his blood sugars on a regular basis. The patient is also doing  his insulin coverage per the instructions of his pcp. He will follow up with the pcp next week. Ask the patient to take his readings and how much insulin he is taking. Incoming call from the patients mother, and DPR asking for assistance with needles. They picked up the order but the needles do not fit. They need the 4mm x 32 gauge Nano  pen needles. Secure in basket message sent to the pcp, pharm D and staff asking for assistance with a new RX for he correct size needles the patient needs. Will continue to monitor for changes.        Reviewed prescribed diet with patient heart healthy/ADA diet- sent planning healthy meals by email and also gave information on the ADA website for review and assistance; Counseled on importance of regular laboratory monitoring as prescribed;        Discussed plans with patient for ongoing care management follow up and provided patient with direct contact information for care management team;      Provided patient with written educational materials related to hypo and hyperglycemia and importance of correct treatment. Sent via email information on hypo and hyperglycemia. The patient denies any acute highs or acute lows. Review of the sx and sx of hyper and hypoglycemia. The patient denies any issues at this time. ;       Reviewed scheduled/upcoming provider appointments including: 10-11-2022. Encouraged the patient to take his glucose readings with him and insulin log. Also to write down any questions the patient has he wishes to address with the pcp.;         Advised patient, providing education and rationale, to check cbg 4 times daily, when you have symptoms of low or high blood sugar, and before and after exercise and record. The patient does not have strips and ask for help with getting strips for his meter. Education provided.    The patient has been having readings of 250 and more at times. He has seen some as low as 190. Education provided that the goal of fasting is <130 and post prandial <180. Did review the notes of the pcp and she wants his blood sugars at 150 of less. Education and support given.      call provider for findings outside established parameters;       Referral made to pharmacy team for assistance with for assistance with medication cost, medication reconciliation, and help with  options for DM management and HTN management. Works with the pharm D on a regular basis.;       Referral made to social work team for assistance with food resources, cost effective eye care, and other needs due to the patient being unemployed. Works with the SW on a regular basis. ;      Review of patient status, including review of consultants reports, relevant laboratory and other test results, and medications completed;       Encourage the patient to increase activity as tolerated and that doing 15 minutes of moderate exercise can assist in lowering his blood sugars. Advised patient to discuss call the office for acute changes in his blood sugars, questions and concerns with provider;      Screening for signs and symptoms of depression related to chronic disease state;        Assessed social determinant of health barriers;         Hypertension: (Status: Goal on Track (progressing): YES.) Long Term Goal  Last practice recorded BP readings:  BP Readings  from Last 3 Encounters:  08/28/22 (!) 120/100  08/29/21 (!) 117/108  10/17/20 136/86   Most recent eGFR/CrCl:  Lab Results  Component Value Date   EGFR 118 08/28/2022    No components found for: "CRCL"  Evaluation of current treatment plan related to hypertension self management and patient's adherence to plan as established by provider;   Provided education to patient re: stroke prevention, s/s of heart attack and stroke; Reviewed prescribed diet heart healthy/ADA diet. The patient is monitoring his dietary intake. Reviewed medications with patient and discussed importance of compliance. States compliance with medications and also works with pharm D on a regular basis. Questions ask about continuing to take Vitamin D and advised the patient to continue as instructed by his provider. Has upcoming pcp visit next week and encouraged him to write down questions to ask the pcp.;  Counseled on the importance of exercise goals with target of 150  minutes per week Discussed plans with patient for ongoing care management follow up and provided patient with direct contact information for care management team; Advised patient, providing education and rationale, to monitor blood pressure daily and record, calling PCP for findings outside established parameters;  Reviewed scheduled/upcoming provider appointments including: 10-11-2022 at 315 pm Advised patient to discuss healthy weight loss program, sleep apnea concerns, questions and concerns about heart health with provider; Provided education on prescribed diet heart Healthy/ADA diet. Will send the patient information about heart healthy/ADA diet ;  Discussed complications of poorly controlled blood pressure such as heart disease, stroke, circulatory complications, vision complications, kidney impairment, sexual dysfunction;  Screening for signs and symptoms of depression related to chronic disease state;  Assessed social determinant of health barriers;   Weight Loss:  (Status: Goal on Track (progressing): YES.)  Advised patient to discuss with primary care provider options regarding weight management;  Provided verbal and/or written education to patient re: provider recommended life style modifications. Education provided on exercise and how it can improve overall health and well being. ;  Screening for signs and symptoms of depression. The patients mother states she can see a positive difference in the patient and his mood and mental health well being. ;  Offered to connect patient with psychology or social work support for counseling and supportive care;  Reviewed recommended dietary changes: avoid fad diets, make small/incremental dietary and exercise changes, eat at the table and avoid eating in front of the TV, plan management of cravings, monitor snacking and cravings in food diary; Advised patient to discuss healthy weight loss options  with provider; Assessed social determinant of  health barriers;   Patient Goals/Self-Care Activities: Take medications as prescribed   Attend all scheduled provider appointments Call pharmacy for medication refills 3-7 days in advance of running out of medications Perform all self care activities independently  Perform IADL's (shopping, preparing meals, housekeeping, managing finances) independently Call provider office for new concerns or questions  Work with the social worker to address care coordination needs and will continue to work with the clinical team to address health care and disease management related needs Work with the pharmacist to address medication management needs and will continue to work with the clinical team to address health care and disease management related needs call the Suicide and Crisis Lifeline: 988 call the [place] National Suicide Prevention Lifeline: (432) 699-9116 or TTY: (865)014-6029 TTY 726-869-1554) to talk to a trained counselor call 1-800-273-TALK (toll free, 24 hour hotline) go to [facility] [address]  [place] [phone]) if experiencing a Mental Health or Behavioral Health Crisis  schedule appointment with eye doctor check blood sugar at prescribed times: 4 times daily, when you have symptoms of low or high blood sugar, and before and after exercise check feet daily for cuts, sores or redness enter blood sugar readings and medication or insulin into daily log take the blood sugar log to all doctor visits trim toenails straight across drink 6 to 8 glasses of water each day eat fish at least once per week fill half of plate with vegetables join a weight loss program limit fast food meals to no more than 1 per week manage portion size prepare main meal at home 3 to 5 days each week read food labels for fat, fiber, carbohydrates and portion size reduce red meat to 2 to 3 times a week set a realistic goal switch to sugar-free drinks keep feet up  while sitting wash and dry feet carefully every day wear comfortable, cotton socks wear comfortable, well-fitting shoes check blood pressure weekly choose a place to take my blood pressure (home, clinic or office, retail store) learn about high blood pressure call doctor for signs and symptoms of high blood pressure develop an action plan for high blood pressure keep all doctor appointments take medications for blood pressure exactly as prescribed begin an exercise program report new symptoms to your doctor eat more whole grains, fruits and vegetables, lean meats and healthy fats           Our next appointment is by telephone on 12-12-2022 at 1 pm  Please call the care guide team at [phone] if you need to cancel or reschedule your appointment.   If you are experiencing a Mental Health or Behavioral Health Crisis or need someone to talk to, please call the Suicide and Crisis Lifeline: 988 call the [place] National Suicide Prevention Lifeline: (409) 208-5412 or TTY: 763-760-1409 TTY (220)689-3857) to talk to a trained counselor call 1-800-273-TALK (toll free, 24 hour hotline) go to [facility] [address], [place] [phone])   The patient verbalized understanding of instructions, educational materials, and care plan provided today and DECLINED offer to receive copy of patient instructions, educational materials, and care plan.     Alto Denver RN, MSN, CCM RN Care Manager  [facility]  Ambulatory Care Management  Direct Number: [phone]

## 2022-10-04 NOTE — Patient Instructions (Signed)
Visit Information  Thank you for taking time to visit with me today. Please don't hesitate to contact me if I can be of assistance to you before our next scheduled telephone appointment.  Following are the goals we discussed today:   Goals Addressed             This Visit's Progress    RNCM Care Management for Effective Management of DM, HTN, and Obesity       Current Barriers:  Knowledge Deficits related to plan of care for management of HTN, DMII, and Obesity  Care Coordination needs related to Limited access to food and Lacks knowledge of community resource: for effective management of obesity, HTN, DM and mental health needs   Chronic Disease Management support and education needs related to HTN, DMII, and Obesity Financial Constraints.  Difficulty obtaining medications  RNCM Clinical Goal(s):  Patient will verbalize basic understanding of HTN, DMII, and Obesity disease process and self health management plan as evidenced by following the plan of care, blood pressures WNL, Blood sugars trending down and stable, A1C,7, healthy weight loss, and establishing positive health and wellness goals for effective management of chronic conditions take all medications exactly as prescribed and will call provider for medication related questions as evidenced by compliance with medications and calling the pcp and pharm D for changes or new needs related to medication needs.     demonstrate improved and ongoing adherence to prescribed treatment plan for HTN, DMII, and Obesity as evidenced by normalized blood pressures and blood sugars, trending downward A1C, and healthy weight loss work with pharmacist to address Financial constraints related to medication cost and having no insurance and Medication procurement related to HTN, DMII, and Obesity as evidenced by review of EMR and patient or pharmacist report    work with Child psychotherapist to address Financial constraints related to food resources, eye exam  and glasses, and unemployement, Limited access to food, and Obesity resources related to the management of HTN, DMII, and Obesity as evidenced by review of EMR and patient or Child psychotherapist report     work with Publishing rights manager care guide to address needs related to Limited access to food and resources for eye exam and glasses as evidenced by patient and/or community resource care guide support    demonstrate ongoing self health care management ability for effective management of HTN, DM, and Obesity as evidenced by     through collaboration with Medical illustrator, provider, and care team.   Interventions: Evaluation of current treatment plan related to  self management and patient's adherence to plan as established by provider   Diabetes:  (Status: Goal on Track (progressing): YES.) Long Term Goal   Lab Results  Component Value Date   HGBA1C 12.4 (H) 08/28/2022   Assessed patient's understanding of A1c goal: <7%. Review of wanting the A1C to come down gradually. The patient is working with the CCM team to effectively manage his health and well being. The patient knows that he needs to get his blood sugars into range to prevent complications and problems with other chronic conditions.  Provided education to patient about basic DM disease process. Review of goals of care and will attach information in the AVS for the patient. The patient has been sent a link via his phone number so he can set up MyChart; Reviewed medications with patient and discussed importance of medication adherence. The patient has the meter for checking blood sugars and is checking his blood sugars on  a regular basis. The patient is also doing his insulin coverage per the instructions of his pcp. He will follow up with the pcp next week. Ask the patient to take his readings and how much insulin he is taking. Incoming call from the patients mother, and DPR asking for assistance with needles. They picked up the order but the needles  do not fit. They need the 4mm x 32 gauge Nano pen needles. Secure in basket message sent to the pcp, pharm D and staff asking for assistance with a new RX for he correct size needles the patient needs. Will continue to monitor for changes.        Reviewed prescribed diet with patient heart healthy/ADA diet- sent planning healthy meals by email and also gave information on the ADA website for review and assistance; Counseled on importance of regular laboratory monitoring as prescribed;        Discussed plans with patient for ongoing care management follow up and provided patient with direct contact information for care management team;      Provided patient with written educational materials related to hypo and hyperglycemia and importance of correct treatment. Sent via email information on hypo and hyperglycemia. The patient denies any acute highs or acute lows. Review of the sx and sx of hyper and hypoglycemia. The patient denies any issues at this time. ;       Reviewed scheduled/upcoming provider appointments including: 10-11-2022. Encouraged the patient to take his glucose readings with him and insulin log. Also to write down any questions the patient has he wishes to address with the pcp.;         Advised patient, providing education and rationale, to check cbg 4 times daily, when you have symptoms of low or high blood sugar, and before and after exercise and record. The patient does not have strips and ask for help with getting strips for his meter. Education provided.    The patient has been having readings of 250 and more at times. He has seen some as low as 190. Education provided that the goal of fasting is <130 and post prandial <180. Did review the notes of the pcp and she wants his blood sugars at 150 of less. Education and support given.      call provider for findings outside established parameters;       Referral made to pharmacy team for assistance with for assistance with medication cost,  medication reconciliation, and help with options for DM management and HTN management. Works with the pharm D on a regular basis.;       Referral made to social work team for assistance with food resources, cost effective eye care, and other needs due to the patient being unemployed. Works with the SW on a regular basis. ;      Review of patient status, including review of consultants reports, relevant laboratory and other test results, and medications completed;       Encourage the patient to increase activity as tolerated and that doing 15 minutes of moderate exercise can assist in lowering his blood sugars. Advised patient to discuss call the office for acute changes in his blood sugars, questions and concerns with provider;      Screening for signs and symptoms of depression related to chronic disease state;        Assessed social determinant of health barriers;         Hypertension: (Status: Goal on Track (progressing): YES.) Long Term Goal  Last practice recorded BP readings:  BP Readings from Last 3 Encounters:  08/28/22 (!) 120/100  08/29/21 (!) 117/108  10/17/20 136/86   Most recent eGFR/CrCl:  Lab Results  Component Value Date   EGFR 118 08/28/2022    No components found for: "CRCL"  Evaluation of current treatment plan related to hypertension self management and patient's adherence to plan as established by provider;   Provided education to patient re: stroke prevention, s/s of heart attack and stroke; Reviewed prescribed diet heart healthy/ADA diet. The patient is monitoring his dietary intake. Reviewed medications with patient and discussed importance of compliance. States compliance with medications and also works with pharm D on a regular basis. Questions ask about continuing to take Vitamin D and advised the patient to continue as instructed by his provider. Has upcoming pcp visit next week and encouraged him to write down questions to ask the pcp.;  Counseled on the  importance of exercise goals with target of 150 minutes per week Discussed plans with patient for ongoing care management follow up and provided patient with direct contact information for care management team; Advised patient, providing education and rationale, to monitor blood pressure daily and record, calling PCP for findings outside established parameters;  Reviewed scheduled/upcoming provider appointments including: 10-11-2022 at 315 pm Advised patient to discuss healthy weight loss program, sleep apnea concerns, questions and concerns about heart health with provider; Provided education on prescribed diet heart Healthy/ADA diet. Will send the patient information about heart healthy/ADA diet ;  Discussed complications of poorly controlled blood pressure such as heart disease, stroke, circulatory complications, vision complications, kidney impairment, sexual dysfunction;  Screening for signs and symptoms of depression related to chronic disease state;  Assessed social determinant of health barriers;   Weight Loss:  (Status: Goal on Track (progressing): YES.)  Advised patient to discuss with primary care provider options regarding weight management;  Provided verbal and/or written education to patient re: provider recommended life style modifications. Education provided on exercise and how it can improve overall health and well being. ;  Screening for signs and symptoms of depression. The patients mother states she can see a positive difference in the patient and his mood and mental health well being. ;  Offered to connect patient with psychology or social work support for counseling and supportive care;  Reviewed recommended dietary changes: avoid fad diets, make small/incremental dietary and exercise changes, eat at the table and avoid eating in front of the TV, plan management of cravings, monitor snacking and cravings in food diary; Advised patient to discuss healthy weight loss options  with  provider; Assessed social determinant of health barriers;   Patient Goals/Self-Care Activities: Take medications as prescribed   Attend all scheduled provider appointments Call pharmacy for medication refills 3-7 days in advance of running out of medications Perform all self care activities independently  Perform IADL's (shopping, preparing meals, housekeeping, managing finances) independently Call provider office for new concerns or questions  Work with the social worker to address care coordination needs and will continue to work with the clinical team to address health care and disease management related needs Work with the pharmacist to address medication management needs and will continue to work with the clinical team to address health care and disease management related needs call the Suicide and Crisis Lifeline: 988 call the [place] National Suicide Prevention Lifeline: 6617775222 or TTY: 709-577-9672 TTY 409 014 1096) to talk to a trained counselor call 1-800-273-TALK (toll free, 24 hour hotline) go to [employer]  [facility] [address] [place] [id number]) if experiencing a Mental Health or Behavioral Health Crisis  schedule appointment with eye doctor check blood sugar at prescribed times: 4 times daily, when you have symptoms of low or high blood sugar, and before and after exercise check feet daily for cuts, sores or redness enter blood sugar readings and medication or insulin into daily log take the blood sugar log to all doctor visits trim toenails straight across drink 6 to 8 glasses of water each day eat fish at least once per week fill half of plate with vegetables join a weight loss program limit fast food meals to no more than 1 per week manage portion size prepare main meal at home 3 to 5 days each week read food labels for fat, fiber, carbohydrates and portion size reduce red meat to 2 to 3 times a week set a realistic  goal switch to sugar-free drinks keep feet up while sitting wash and dry feet carefully every day wear comfortable, cotton socks wear comfortable, well-fitting shoes check blood pressure weekly choose a place to take my blood pressure (home, clinic or office, retail store) learn about high blood pressure call doctor for signs and symptoms of high blood pressure develop an action plan for high blood pressure keep all doctor appointments take medications for blood pressure exactly as prescribed begin an exercise program report new symptoms to your doctor eat more whole grains, fruits and vegetables, lean meats and healthy fats           Our next appointment is by telephone on 12-12-2022 at 1 pm  Please call the care guide team at [phone] if you need to cancel or reschedule your appointment.   If you are experiencing a Mental Health or Behavioral Health Crisis or need someone to talk to, please call the Suicide and Crisis Lifeline: 988 call the [place] National Suicide Prevention Lifeline: 380-328-8797 or TTY: 714-477-9696 TTY 339-433-7508) to talk to a trained counselor call 1-800-273-TALK (toll free, 24 hour hotline) go to [facility] [address], [place] [phone])   The patient verbalized understanding of instructions, educational materials, and care plan provided today and DECLINED offer to receive copy of patient instructions, educational materials, and care plan.   Telephone follow up appointment with care management team member scheduled for: 12-12-2022 at 1 pm  Alto Denver RN, MSN, CCM RN Care Manager  [facility]  Ambulatory Care Management  Direct Number: [phone]

## 2022-10-11 ENCOUNTER — Encounter: Payer: Self-pay | Admitting: Nurse Practitioner

## 2022-10-11 ENCOUNTER — Ambulatory Visit: Payer: Managed Care, Other (non HMO) | Admitting: Nurse Practitioner

## 2022-10-11 ENCOUNTER — Other Ambulatory Visit (HOSPITAL_BASED_OUTPATIENT_CLINIC_OR_DEPARTMENT_OTHER): Payer: Self-pay

## 2022-10-11 VITALS — BP 128/84 | HR 118 | Wt >= 6400 oz

## 2022-10-11 DIAGNOSIS — E1169 Type 2 diabetes mellitus with other specified complication: Secondary | ICD-10-CM

## 2022-10-11 DIAGNOSIS — B372 Candidiasis of skin and nail: Secondary | ICD-10-CM

## 2022-10-11 DIAGNOSIS — E1159 Type 2 diabetes mellitus with other circulatory complications: Secondary | ICD-10-CM

## 2022-10-11 DIAGNOSIS — Z794 Long term (current) use of insulin: Secondary | ICD-10-CM

## 2022-10-11 DIAGNOSIS — Z23 Encounter for immunization: Secondary | ICD-10-CM | POA: Diagnosis not present

## 2022-10-11 DIAGNOSIS — I152 Hypertension secondary to endocrine disorders: Secondary | ICD-10-CM

## 2022-10-11 MED ORDER — VALSARTAN 40 MG PO TABS
40.0000 mg | ORAL_TABLET | Freq: Every day | ORAL | 3 refills | Status: AC
Start: 2022-10-11 — End: ?
  Filled 2022-10-11: qty 30, 30d supply, fill #0
  Filled 2022-11-14: qty 30, 30d supply, fill #1
  Filled 2023-01-11: qty 90, 90d supply, fill #2
  Filled 2023-01-29: qty 30, 30d supply, fill #2
  Filled 2023-04-25 – 2023-06-28 (×3): qty 30, 30d supply, fill #3

## 2022-10-11 MED ORDER — SEMAGLUTIDE(0.25 OR 0.5MG/DOS) 2 MG/3ML ~~LOC~~ SOPN
PEN_INJECTOR | SUBCUTANEOUS | 1 refills | Status: DC
Start: 2022-10-11 — End: 2022-10-24
  Filled 2022-10-11: qty 3, 42d supply, fill #0

## 2022-10-11 NOTE — Patient Instructions (Signed)
WEIGHT LOSS PLANNING Your progress today shows:     10/11/2022    3:15 PM 08/28/2022    2:04 PM 08/29/2021    9:23 AM  Vitals with BMI  Height  6\' 0"  6\' 0"   Weight 508 lbs 502 lbs 510 lbs 2 oz  BMI  68.07 69.17  Systolic 128 120 161  Diastolic 84 100 108  Pulse 118 99 129    For best management of weight, it is vital to balance intake versus output. This means the number of calories burned per day must be less than the calories you take in with food and drink.   I recommend trying to follow a diet with the following: Calories: 1200-1500 calories per day Carbohydrates: 150-180 grams of carbohydrates per day  Why: Gives your body enough "quick fuel" for cells to maintain normal function without sending them into starvation mode.  Protein: At least 90 grams of protein per day- 30 grams with each meal Why: Protein takes longer and uses more energy than carbohydrates to break down for fuel. The carbohydrates in your meals serves as quick energy sources and proteins help use some of that extra quick energy to break down to produce long term energy. This helps you not feel hungry as quickly and protein breakdown burns calories.  Water: Drink AT LEAST 64 ounces of water per day  Why: Water is essential to healthy metabolism. Water helps to fill the stomach and keep you fuller longer. Water is required for healthy digestion and filtering of waste in the body.  Fat: Limit fats in your diet- when choosing fats, choose foods with lower fats content such as lean meats (chicken, fish, [place]).  Why: Increased fat intake leads to storage "for later". Once you burn your carbohydrate energy, your body goes into fat and protein breakdown mode to help you loose weight.  Cholesterol: Fats and oils that are LIQUID at room temperature are best. Choose vegetable oils (olive oil, avocado oil, nuts). Avoid fats that are SOLID at room temperature (animal fats, processed meats). Healthy fats are often found in whole  grains, beans, nuts, seeds, and berries.  Why: Elevated cholesterol levels lead to build up of cholesterol on the inside of your blood vessels. This will eventually cause the blood vessels to become hard and can lead to high blood pressure and damage to your organs. When the blood flow is reduced, but the pressure is high from cholesterol buildup, parts of the cholesterol can break off and form clots that can go to the brain or heart leading to a stroke or heart attack.  Fiber: Increase amount of SOLUBLE the fiber in your diet. This helps to fill you up, lowers cholesterol, and helps with digestion. Some foods high in soluble fiber are oats, peas, beans, apples, carrots, barley, and citrus fruits.   Why: Fiber fills you up, helps remove excess cholesterol, and aids in healthy digestion which are all very important in weight management.   I recommend the following as a minimum activity routine: Purposeful walk or other physical activity at least 20 minutes every single day. This means purposefully taking a walk, jog, bike, swim, treadmill, elliptical, dance, etc.  This activity should be ABOVE your normal daily activities, such as walking at work. Goal exercise should be at least 150 minutes a week- work your way up to this.   Heart Rate: Your maximum exercise heart rate should be 220 - Your Age in Years. When exercising, get your heart rate up,  but avoid going over the maximum targeted heart rate.  60-70% of your maximum heart rate is where you tend to burn the most fat. To find this number:  220 - Age In Years= Max HR  Max HR x 0.6 (or 0.7) = Fat Burning HR The Fat Burning HR is your goal heart rate while working out to burn the most fat.  NEVER exercise to the point your feel lightheaded, weak, nauseated, dizzy. If you experience ANY of these symptoms- STOP exercise! Allow yourself to cool down and your heart rate to come down. Then restart slower next time.  If at ANY TIME you feel chest pain  or chest pressure during exercise, STOP IMMEDIATELY and seek medical attention.

## 2022-10-11 NOTE — Progress Notes (Signed)
Shawna Clamp, DNP, AGNP-c [facility]  [address] [place], [place] [address] [phone]  ESTABLISHED PATIENT- Chronic Health and/or Follow-Up Visit  Blood pressure 128/84, pulse (!) 118, weight (!) 508 lb (230.4 kg).    Matthew Wiggins is a 37 y.o. year old male presenting today for evaluation and management of chronic conditions.  History of Present Illness The patient, with a history of diabetes and hypertension, presents with difficulties in managing his insulin regimen due to a reported issue with the pharmacy providing incorrect needles. The patient's blood sugars have been fluctuating, with a recent reading of 260, but he has not been consistently monitoring due to the insulin administration issue. He reports no side effects from his current medication.  The patient has been taking a vitamin D supplement weekly, with one dose remaining. He is also on 12 units of Levemir insulin once daily. The patient admits to having stopped checking his blood sugar due to the insulin administration issue. He denies any increased hunger or thirst, and reports no changes in vision or any numbness or tingling in his hands or feet.  The patient has not been taking his prescribed blood pressure medication due to confusion about which medication to take. He has been taking a vitamin D supplement weekly. The patient reports no side effects from his current medication regimen.  The patient's rash, previously discussed, has resolved. He has been provided with educational materials on diet and diabetes, and has been receiving regular calls from a Midwife for support and check-ins. The patient acknowledges the need for better management of his diabetes and is open to adding new medications to his regimen to improve blood sugar control.  The patient also reports not drinking enough water, as evidenced by skin turgor. He has been advised to increase his water intake.  The patient has not been exercising regularly but plans to start with arm exercises and walking around the house.   All ROS negative with exception of what is listed above.   PHYSICAL EXAM Physical Exam Vitals and nursing note reviewed.  Constitutional:      Appearance: He is obese.  HENT:     Head: Normocephalic.  Eyes:     Conjunctiva/sclera: Conjunctivae normal.  Neck:     Vascular: No carotid bruit.  Cardiovascular:     Rate and Rhythm: Normal rate and regular rhythm.     Pulses: Normal pulses.     Heart sounds: Normal heart sounds.  Pulmonary:     Effort: Pulmonary effort is normal.     Breath sounds: Normal breath sounds.  Abdominal:     General: Bowel sounds are normal.     Tenderness: There is no abdominal tenderness. There is no guarding.  Musculoskeletal:     Right lower leg: Edema present.     Left lower leg: Edema present.  Skin:    General: Skin is warm.     Capillary Refill: Capillary refill takes less than 2 seconds.  Neurological:     General: No focal deficit present.     Mental Status: He is alert and oriented to person, place, and time.     Sensory: No sensory deficit.     Gait: Gait abnormal.      PLAN Problem List Items Addressed This Visit     Morbid obesity (HCC)    In the setting of HTN, HLD, DM not well controlled, strong recommendation for management of weight to reduce comorbidities. We will attempt to  get coverage for [facility] or Ozempic as these would be most effective for overall control and weight management.  Diet and exercise recommendations discussed.       Relevant Orders   Microalbumin / creatinine urine ratio (Completed)   Hypertension associated with diabetes (HCC)    Patient has not been taking antihypertensive medication due to confusion about medication. Blood pressure slightly elevated at current visit. -Start Valsartan at a low dose once daily. -Monitor blood pressure.      Relevant Medications   valsartan (DIOVAN)  40 MG tablet   Other Relevant Orders   Microalbumin / creatinine urine ratio (Completed)   Type 2 diabetes mellitus with other specified complication (HCC) - Primary    Suboptimal control with Levemir 12 units daily. No symptoms of hyperglycemia. Patient had difficulty with insulin administration due to incorrect needles. -Continue Levemir 12 units daily. -Add Ozempic once weekly. -Adjust Levemir dose based on fasting blood glucose:increase by 3 units if >120, decrease by 3 units if <120, adjust every 3 days. -Encourage regular blood glucose monitoring. -Encourage hydration and physical activity.      Relevant Medications   valsartan (DIOVAN) 40 MG tablet   Other Relevant Orders   Microalbumin / creatinine urine ratio (Completed)   Candidal intertrigo    Patient had a rash at last visit which has since resolved after treatment with Diflucan. -No further treatment needed at this time. If rash recurs, patient to notify clinic.      Other Visit Diagnoses     Need for influenza vaccination       Relevant Orders   Flu vaccine trivalent PF, 6mos and older(Flulaval,Afluria,Fluarix,Fluzone) (Completed)       Return in about 3 months (around 01/11/2023) for Med Management 30.  Shawna Clamp, DNP, AGNP-c

## 2022-10-12 ENCOUNTER — Other Ambulatory Visit (HOSPITAL_BASED_OUTPATIENT_CLINIC_OR_DEPARTMENT_OTHER): Payer: Self-pay

## 2022-10-12 ENCOUNTER — Other Ambulatory Visit: Payer: Self-pay

## 2022-10-12 LAB — MICROALBUMIN / CREATININE URINE RATIO
Creatinine, Urine: 168.9 mg/dL
Microalb/Creat Ratio: 24 mg/g{creat} (ref 0–29)
Microalbumin, Urine: 39.9 ug/mL

## 2022-10-14 ENCOUNTER — Other Ambulatory Visit (HOSPITAL_BASED_OUTPATIENT_CLINIC_OR_DEPARTMENT_OTHER): Payer: Self-pay

## 2022-10-24 ENCOUNTER — Other Ambulatory Visit (HOSPITAL_BASED_OUTPATIENT_CLINIC_OR_DEPARTMENT_OTHER): Payer: Self-pay

## 2022-10-24 ENCOUNTER — Telehealth: Payer: Self-pay | Admitting: Nurse Practitioner

## 2022-10-24 DIAGNOSIS — R809 Proteinuria, unspecified: Secondary | ICD-10-CM

## 2022-10-24 DIAGNOSIS — G4733 Obstructive sleep apnea (adult) (pediatric): Secondary | ICD-10-CM

## 2022-10-24 DIAGNOSIS — E1159 Type 2 diabetes mellitus with other circulatory complications: Secondary | ICD-10-CM

## 2022-10-24 DIAGNOSIS — Z794 Long term (current) use of insulin: Secondary | ICD-10-CM

## 2022-10-24 MED ORDER — TRULICITY 1.5 MG/0.5ML ~~LOC~~ SOAJ
1.5000 mg | SUBCUTANEOUS | 0 refills | Status: DC
Start: 2022-10-24 — End: 2023-04-18
  Filled 2022-10-24 – 2023-01-11 (×2): qty 2, 28d supply, fill #0

## 2022-10-24 MED ORDER — TRULICITY 0.75 MG/0.5ML ~~LOC~~ SOAJ
0.7500 mg | SUBCUTANEOUS | 0 refills | Status: DC
Start: 2022-10-24 — End: 2023-02-28
  Filled 2022-10-24 – 2022-11-22 (×2): qty 2, 28d supply, fill #0

## 2022-10-24 MED ORDER — TRULICITY 3 MG/0.5ML ~~LOC~~ SOAJ
3.0000 mg | SUBCUTANEOUS | 0 refills | Status: DC
Start: 2022-10-24 — End: 2023-07-03
  Filled 2022-10-24 – 2023-06-28 (×6): qty 2, 28d supply, fill #0

## 2022-10-24 NOTE — Telephone Encounter (Signed)
Medication changed to trulicity and sent to pharmacy.

## 2022-10-24 NOTE — Telephone Encounter (Signed)
Pt mom called and states their insurance wont pay for ozempic and asks if he could switch to Trulicity instead?

## 2022-10-25 ENCOUNTER — Other Ambulatory Visit (HOSPITAL_BASED_OUTPATIENT_CLINIC_OR_DEPARTMENT_OTHER): Payer: Self-pay

## 2022-10-25 NOTE — Telephone Encounter (Signed)
PA approved, pharmacy notified.

## 2022-10-25 NOTE — Telephone Encounter (Signed)
PA for Trulicity submitted via CoverMyMeds. Key: [id number], pending decision.  Sherrill Raring, PharmD Clinical Pharmacist [phone]

## 2022-10-29 NOTE — Assessment & Plan Note (Signed)
Suboptimal control with Levemir 12 units daily. No symptoms of hyperglycemia. Patient had difficulty with insulin administration due to incorrect needles. -Continue Levemir 12 units daily. -Add Ozempic once weekly. -Adjust Levemir dose based on fasting blood glucose:increase by 3 units if >120, decrease by 3 units if <120, adjust every 3 days. -Encourage regular blood glucose monitoring. -Encourage hydration and physical activity.

## 2022-10-29 NOTE — Assessment & Plan Note (Signed)
In the setting of HTN, HLD, DM not well controlled, strong recommendation for management of weight to reduce comorbidities. We will attempt to get coverage for [facility] or Ozempic as these would be most effective for overall control and weight management.  Diet and exercise recommendations discussed.

## 2022-10-29 NOTE — Assessment & Plan Note (Signed)
Patient had a rash at last visit which has since resolved after treatment with Diflucan. -No further treatment needed at this time. If rash recurs, patient to notify clinic.

## 2022-10-29 NOTE — Assessment & Plan Note (Signed)
Patient has not been taking antihypertensive medication due to confusion about medication. Blood pressure slightly elevated at current visit. -Start Valsartan at a low dose once daily. -Monitor blood pressure.

## 2022-10-30 ENCOUNTER — Other Ambulatory Visit (HOSPITAL_BASED_OUTPATIENT_CLINIC_OR_DEPARTMENT_OTHER): Payer: Self-pay

## 2022-11-05 ENCOUNTER — Ambulatory Visit: Payer: Self-pay | Admitting: Licensed Clinical Social Worker

## 2022-11-05 NOTE — Patient Outreach (Signed)
  Care Coordination   Follow Up Visit Note   11/05/2022 Name: Matthew Wiggins MRN: [id number] DOB: Apr 01, 1985  Matthew Wiggins is a 37 y.o. year old male who sees Early, Sung Amabile, NP for primary care. I spoke with  Matthew Wiggins by phone today.  What matters to the patients health and wellness today?  Symptom Management, Goal Setting    Goals Addressed             This Visit's Progress    LCSW-Obtain Supporive Resources   On track    Activities and task to complete in order to accomplish goals.   Keep all upcoming appointments discussed today Continue with compliance of taking medication prescribed by Doctor Implement healthy coping skills discussed to assist with management of symptoms Continue working with [name] care team to assist with goals identified Review supportive resources provided on Out of the [employer], [employer] plans for [name], [facility], and [employer] transportation application          SDOH assessments and interventions completed:  No     Care Coordination Interventions:  Yes, provided  Interventions Today    Flowsheet Row Most Recent Value  Chronic Disease   Chronic disease during today's visit Diabetes, Hypertension (HTN)  General Interventions   General Interventions Discussed/Reviewed General Interventions Reviewed, [employer], Doctor Visits, Communication with  Matthew Wiggins is doing well and has maintained a positive attitude of modifying diet and participatin in exercise to manage chronic health conditions. LCSW discussed strategies to assist with maintaining consistency and goal planning]  Doctor Visits Discussed/Reviewed Doctor Visits Reviewed  Communication with Pharmacists  [LCSW collaborated with Pharm D, requesting she f/up with family about Ozempic prescription and status to Prior Auth]  Exercise Interventions   Exercise Discussed/Reviewed Exercise Reviewed  [Pt's mother has obtained sitting exercises and plan to  participate in exercise with pt]  Mental Health Interventions   Mental Health Discussed/Reviewed Mental Health Reviewed, Coping Strategies  Nutrition Interventions   Nutrition Discussed/Reviewed Nutrition Reviewed  [Pt has increased water intake and continues to work on modifying diet to assist with management of chronic health conditions]  Pharmacy Interventions   Pharmacy Dicussed/Reviewed Pharmacy Topics Reviewed, Medication Adherence, Affording Medications       Follow up plan: Follow up call scheduled for 4-6 weeks    Encounter Outcome:  Patient Visit Completed   Matthew Wiggins, MSW, LCSW [facility] Care Management [facility]  [facility] [email or web address] Phone [phone] 4:33 PM

## 2022-11-05 NOTE — Patient Instructions (Signed)
Visit Information  Thank you for taking time to visit with me today. Please don't hesitate to contact me if I can be of assistance to you.   Following are the goals we discussed today:   Goals Addressed             This Visit's Progress    LCSW-Obtain Supporive Resources   On track    Activities and task to complete in order to accomplish goals.   Keep all upcoming appointments discussed today Continue with compliance of taking medication prescribed by Doctor Implement healthy coping skills discussed to assist with management of symptoms Continue working with [facility] care team to assist with goals identified Review supportive resources provided on Out of the [employer], [employer] for [place], Vocational Rehab, and [employer] transportation application          Our next appointment is by telephone on 12/16 at 1 PM  Please call the care guide team at [phone] if you need to cancel or reschedule your appointment.   If you are experiencing a Mental Health or Behavioral Health Crisis or need someone to talk to, please call the Suicide and Crisis Lifeline: 988 call 911   Patient verbalizes understanding of instructions and care plan provided today and agrees to view in MyChart. Active MyChart status and patient understanding of how to access instructions and care plan via MyChart confirmed with patient.     Jenel Lucks, MSW, LCSW [facility] Care Management [place]  [facility] [email or web address] Phone [phone] 4:33 PM

## 2022-11-06 ENCOUNTER — Other Ambulatory Visit (HOSPITAL_BASED_OUTPATIENT_CLINIC_OR_DEPARTMENT_OTHER): Payer: Self-pay

## 2022-11-14 ENCOUNTER — Other Ambulatory Visit (HOSPITAL_BASED_OUTPATIENT_CLINIC_OR_DEPARTMENT_OTHER): Payer: Self-pay

## 2022-11-15 ENCOUNTER — Other Ambulatory Visit: Payer: Self-pay

## 2022-11-16 ENCOUNTER — Other Ambulatory Visit (HOSPITAL_BASED_OUTPATIENT_CLINIC_OR_DEPARTMENT_OTHER): Payer: Self-pay

## 2022-11-22 ENCOUNTER — Other Ambulatory Visit (HOSPITAL_BASED_OUTPATIENT_CLINIC_OR_DEPARTMENT_OTHER): Payer: Self-pay

## 2022-12-06 ENCOUNTER — Other Ambulatory Visit (HOSPITAL_BASED_OUTPATIENT_CLINIC_OR_DEPARTMENT_OTHER): Payer: Self-pay

## 2022-12-06 ENCOUNTER — Other Ambulatory Visit: Payer: Commercial Managed Care - HMO

## 2022-12-06 ENCOUNTER — Other Ambulatory Visit: Payer: Self-pay

## 2022-12-06 DIAGNOSIS — Z794 Long term (current) use of insulin: Secondary | ICD-10-CM

## 2022-12-06 MED ORDER — INSULIN DETEMIR 100 UNIT/ML ~~LOC~~ SOLN
10.0000 [IU] | Freq: Every day | SUBCUTANEOUS | 0 refills | Status: AC
  Filled 2022-12-06: qty 10, 100d supply, fill #0

## 2022-12-06 NOTE — Progress Notes (Signed)
12/06/2022 Name: Matthew Wiggins MRN: [id number] DOB: Oct 17, 1985  Chief Complaint  Patient presents with   Diabetes   Medication Management    Matthew Wiggins is a 37 y.o. year old male who presented for a telephone visit.   They were referred to the pharmacist by their PCP for assistance in managing diabetes.    Subjective:  Care Team: Primary Care Provider: Tollie Eth, NP ; Next Scheduled Visit: 10/11/22  Medication Access/Adherence  [facility]acy:  [facility] [address] [place] [address] Phone: [phone] Fax: [phone]   Patient reports affordability concerns with their medications: No  Patient reports access/transportation concerns to their pharmacy: No  Patient reports adherence concerns with their medications:  No     Diabetes:  Current medications: Levemir 20 units daily, Trulicity 0.75mg  (started 12/03/22) Medications tried in the past: Metformin, Lantus, Basaglar Currently on: Levemir  Current glucose readings: fasting 200 today Checking fasting once daily, reports all readings above 130 and denies any lows   Patient denies hypoglycemic s/sx including dizziness, shakiness, sweating. Patient denies hyperglycemic symptoms including polyuria, polydipsia, polyphagia, nocturia, neuropathy, blurred vision.   Objective:  Lab Results  Component Value Date   HGBA1C 12.4 (H) 08/28/2022    Lab Results  Component Value Date   CREATININE 0.77 08/28/2022   BUN 9 08/28/2022   NA 135 08/28/2022   K 4.8 08/28/2022   CL 97 08/28/2022   CO2 24 08/28/2022    Lab Results  Component Value Date   CHOL 164 08/28/2022   HDL 36 (L) 08/28/2022   LDLCALC 112 (H) 08/28/2022   TRIG 82 08/28/2022   CHOLHDL 4.6 08/28/2022    Medications Reviewed Today     Reviewed by Sherrill Raring, RPH (Pharmacist) on 12/06/22 at 1333  Med List Status: <None>   Medication Order Taking? Sig Documenting  Provider Last Dose Status Informant  Blood Glucose Monitoring Suppl (ONE TOUCH ULTRA MINI) w/Device KIT [id number]  Use in the morning, at noon, and at bedtime. Tollie Eth, NP  Active   Dulaglutide (TRULICITY) 0.75 MG/0.5ML Ivory Broad [id number] Yes Inject 0.75 mg into the skin once a week. Tollie Eth, NP Taking Active   Dulaglutide (TRULICITY) 1.5 MG/0.5ML Ivory Broad [id number]  Inject 1.5 mg into the skin once a week. Month 2 Early, Sung Amabile, NP  Active   Dulaglutide (TRULICITY) 3 MG/0.5ML Ivory Broad [id number]  Inject 3 mg as directed once a week. Month 3. Early, Sung Amabile, NP  Active   insulin detemir (LEVEMIR) 100 UNIT/ML injection [id number]  Inject 10 units into the skin once a day. Tollie Eth, NP  Active            Med Note Sherrill Raring   Thu Dec 06, 2022  1:21 PM) 20 units a day  Insulin Pen Needle 32G X 4 MM MISC [id number]  Use daily Early, Sung Amabile, NP  Active   valsartan (DIOVAN) 40 MG tablet [id number]  Take 1 tablet (40 mg total) by mouth daily. For blood pressure. Tollie Eth, NP  Active   Vitamin D, Ergocalciferol, (DRISDOL) 1.25 MG (50000 UNIT) CAPS capsule [id number]  Take 1 capsule (50,000 Units total) by mouth every 7 (seven) days. Tollie Eth, NP  Active               Assessment/Plan:   Diabetes: - Currently uncontrolled - Reviewed long term cardiovascular and renal outcomes of uncontrolled blood sugar - Reviewed  goal A1c, goal fasting, and goal 2 hour post prandial glucose - Reviewed dietary modifications including low carb diet - Recommend to INCREASE Levemir to 24 units daily  - Recommend to check glucose once daily -Requested refill on Levemir and Vit D from PCP    Follow Up Plan: 2 weeks    Sherrill Raring, PharmD Clinical Pharmacist [phone]

## 2022-12-07 ENCOUNTER — Telehealth: Payer: Self-pay | Admitting: *Deleted

## 2022-12-07 NOTE — Progress Notes (Signed)
  Care Coordination Note  12/07/2022 Name: Matthew Wiggins MRN: [id number] DOB: [birth year]  [name] is a 36 y.o. year old male who is a primary care patient of Early, Sung Amabile, NP and is actively engaged with the care management team. I reached out to [name] by phone today to assist with re-scheduling a follow up visit with the RN Case Manager  Follow up plan: Unsuccessful telephone outreach attempt made. A HIPAA compliant phone message was left for the patient providing contact information and requesting a return call.   [facility]  Care Coordination Care Guide  Direct Dial: [phone]

## 2022-12-12 ENCOUNTER — Other Ambulatory Visit: Payer: Self-pay

## 2022-12-17 ENCOUNTER — Ambulatory Visit: Payer: Self-pay | Admitting: Licensed Clinical Social Worker

## 2022-12-17 NOTE — Progress Notes (Signed)
  Care Coordination Note  12/17/2022 Name: AMANCIO LANDOWSKI MRN: [id number] DOB: 1985-11-27  Noralee Chars Lawther is a 37 y.o. year old male who is a primary care patient of Early, Sung Amabile, NP and is actively engaged with the care management team. I reached out to [name] by phone today to assist with re-scheduling a follow up visit with the RN Case Manager  Follow up plan: Telephone appointment with care management team member scheduled for:01/11/23  [facility] Coordination Care Guide  Direct Dial: [phone]

## 2022-12-21 NOTE — Patient Instructions (Signed)
Visit Information  Thank you for taking time to visit with me today. Please don't hesitate to contact me if I can be of assistance to you.   Following are the goals we discussed today:   Goals Addressed             This Visit's Progress    LCSW-Obtain Supporive Resources   On track    Activities and task to complete in order to accomplish goals.   Keep all upcoming appointments discussed today Continue with compliance of taking medication prescribed by Doctor Implement healthy coping skills discussed to assist with management of symptoms Continue working with [facility] care team to assist with goals identified Review supportive resources provided on Out of the [employer], [employer] plans for [place], Vocational Rehab, and [employer] transportation application          Our next appointment is by telephone on 1/27 at 1 pm  Please call the care guide team at [phone] if you need to cancel or reschedule your appointment.   If you are experiencing a Mental Health or Behavioral Health Crisis or need someone to talk to, please call the Suicide and Crisis Lifeline: 988 call 911   Patient verbalizes understanding of instructions and care plan provided today and agrees to view in MyChart. Active MyChart status and patient understanding of how to access instructions and care plan via MyChart confirmed with patient.     Jenel Lucks, MSW, LCSW [facility] Care Management [place]  [facility] [email or web address] Phone [phone] 6:01 AM

## 2022-12-21 NOTE — Patient Outreach (Signed)
  Care Coordination   Follow Up Visit Note   12/17/2022 Name: EMYR PAPALEO MRN: [id number] DOB: 1985/07/03  Noralee Chars Duplessis is a 37 y.o. year old male who sees Early, Sung Amabile, NP for primary care. I spoke with  [name] by phone today.  What matters to the patients health and wellness today?  Supportive Resources    Goals Addressed             This Visit's Progress    LCSW-Obtain Supporive Resources   On track    Activities and task to complete in order to accomplish goals.   Keep all upcoming appointments discussed today Continue with compliance of taking medication prescribed by Doctor Implement healthy coping skills discussed to assist with management of symptoms Continue working with [facility] care team to assist with goals identified Review supportive resources provided on Out of the [employer], [employer] plans for [place], Vocational Rehab, and [employer] transportation application          SDOH assessments and interventions completed:  No     Care Coordination Interventions:  Yes, provided  Interventions Today    Flowsheet Row Most Recent Value  Chronic Disease   Chronic disease during today's visit Hypertension (HTN), Diabetes  General Interventions   General Interventions Discussed/Reviewed General Interventions Reviewed, Doctor Visits  Doctor Visits Discussed/Reviewed Doctor Visits Reviewed  Mental Health Interventions   Mental Health Discussed/Reviewed Mental Health Reviewed  Pharmacy Interventions   Pharmacy Dicussed/Reviewed Pharmacy Topics Reviewed, Medication Adherence       Follow up plan: Follow up call scheduled for 4-6 weeks    Encounter Outcome:  Patient Visit Completed   Jenel Lucks, MSW, LCSW [facility] Care Management [facility]  [facility] [email or web address] Phone [phone] 5:59 AM

## 2023-01-11 ENCOUNTER — Other Ambulatory Visit: Payer: Self-pay

## 2023-01-11 ENCOUNTER — Other Ambulatory Visit (HOSPITAL_COMMUNITY): Payer: Self-pay

## 2023-01-11 ENCOUNTER — Telehealth: Payer: Self-pay

## 2023-01-11 ENCOUNTER — Other Ambulatory Visit (HOSPITAL_BASED_OUTPATIENT_CLINIC_OR_DEPARTMENT_OTHER): Payer: Self-pay

## 2023-01-11 ENCOUNTER — Ambulatory Visit: Payer: Self-pay

## 2023-01-11 NOTE — Patient Outreach (Signed)
  Care Coordination   Follow Up Visit Note   01/11/2023 Name: Matthew Wiggins MRN: [id number] DOB: 1985-01-17  Matthew Wiggins is a 38 y.o. year old male who sees Early, Camie BRAVO, NP for primary care. I spoke with  Matthew Wiggins by phone today.  What matters to the patients health and wellness today?  Patient would like to have his nasal congestin resolve without complications or worsening symptoms.     Goals Addressed             This Visit's Progress    RN Care Coorination Activities: further follow up needed       Care Coordination Interventions: Completed successful outbound call with patient for review of dm Determined patient is not feeling well today, he is c/o nasal congestion that started yesterday, his mother has recently experienced the same symptoms with cough and she has recovered Discussed patient would like to reschedule the dm f/u call, he declines outreach to his PCP provider at this time Educated patient about the importance of staying well hydrated with water and balancing his activity with plenty of rest Instructed patient to notify his PCP of new or worsening symptoms promptly Discussed patient options for a virtual visit with PCP and or Urgent Care visit if needed and patient states he will consider if symptoms worsen Discussed plans with patient for ongoing care coordination follow up and provided patient with direct contact information for nurse care coordinator     Interventions Today    Flowsheet Row Most Recent Value  Chronic Disease   Chronic disease during today's visit Other  [cold symptoms]  General Interventions   General Interventions Discussed/Reviewed General Interventions Discussed, General Interventions Reviewed, Communication with  Communication with PCP/Specialists  Montie Early NP]  Education Interventions   Education Provided Provided Education  Provided Verbal Education On Nutrition, When to see the doctor  Nutrition Interventions    Nutrition Discussed/Reviewed Nutrition Discussed, Nutrition Reviewed, Fluid intake          SDOH assessments and interventions completed:  No     Care Coordination Interventions:  Yes, provided   Follow up plan: Follow up call scheduled for 01/25/23 @12 :00 PM    Encounter Outcome:  Patient Visit Completed

## 2023-01-11 NOTE — Patient Instructions (Signed)
 Visit Information  Thank you for taking time to visit with me today. Please don't hesitate to contact me if I can be of assistance to you.   Following are the goals we discussed today:   Goals Addressed             This Visit's Progress    RN Care Coorination Activities: further follow up needed       Care Coordination Interventions: Completed successful outbound call with patient for review of dm Determined patient is not feeling well today, he is c/o nasal congestion that started yesterday, his mother has recently experienced the same symptoms with cough and she has recovered Discussed patient would like to reschedule the dm f/u call, he declines outreach to his PCP provider at this time Educated patient about the importance of staying well hydrated with water and balancing his activity with plenty of rest Instructed patient to notify his PCP of new or worsening symptoms promptly Discussed patient options for a virtual visit with PCP and or Urgent Care visit if needed and patient states he will consider if symptoms worsen Discussed plans with patient for ongoing care coordination follow up and provided patient with direct contact information for nurse care coordinator           Our next appointment is by telephone on 01/25/23 at 12:00 PM  Please call the care guide team at [phone] if you need to cancel or reschedule your appointment.   If you are experiencing a Mental Health or Behavioral Health Crisis or need someone to talk to, please call 1-800-273-TALK (toll free, 24 hour hotline)  Patient verbalizes understanding of instructions and care plan provided today and agrees to view in MyChart. Active MyChart status and patient understanding of how to access instructions and care plan via MyChart confirmed with patient.     Clayborne Ly RN BSN CCM [place]  [facility], [facility] Nurse Care Coordinator  Direct Dial: [phone] Website:  [email or web address]

## 2023-01-11 NOTE — Telephone Encounter (Signed)
 Pharmacy Patient Advocate Encounter  Insurance verification completed.   The patient is insured through [employer] test claim for Trulicity . Currently a quantity of 2ml is a 30 day supply and the co-pay is 25.00. No Prior Authorization is needed at this time.     This test claim was processed through [facility]- copay amounts may vary at other pharmacies due to pharmacy/plan contracts, or as the patient moves through the different stages of their insurance plan.

## 2023-01-14 ENCOUNTER — Other Ambulatory Visit (HOSPITAL_BASED_OUTPATIENT_CLINIC_OR_DEPARTMENT_OTHER): Payer: Self-pay

## 2023-01-15 ENCOUNTER — Encounter: Payer: Self-pay | Admitting: Licensed Clinical Social Worker

## 2023-01-21 ENCOUNTER — Other Ambulatory Visit (HOSPITAL_BASED_OUTPATIENT_CLINIC_OR_DEPARTMENT_OTHER): Payer: Self-pay

## 2023-01-22 ENCOUNTER — Other Ambulatory Visit (HOSPITAL_BASED_OUTPATIENT_CLINIC_OR_DEPARTMENT_OTHER): Payer: Self-pay

## 2023-01-24 ENCOUNTER — Ambulatory Visit: Payer: Commercial Managed Care - HMO | Admitting: Nurse Practitioner

## 2023-01-24 NOTE — Progress Notes (Deleted)
  Shawna Clamp, DNP, AGNP-c [facility] Medicine  [address] [place], [place] [address] [phone]  ESTABLISHED PATIENT- Chronic Health and/or Follow-Up Visit  There were no vitals taken for this visit.    Matthew Wiggins is a 38 y.o. year old male presenting today for evaluation and management of chronic diabetes.     All ROS negative with exception of what is listed above.   PHYSICAL EXAM Physical Exam {Physical Exam:31112}  PLAN Problem List Items Addressed This Visit   None Ideally, I feel he would most benefit from [facility] due to the mechanism of action and the patients current status.   No follow-ups on file.  Shawna Clamp, DNP, AGNP-c

## 2023-01-25 ENCOUNTER — Ambulatory Visit: Payer: Self-pay

## 2023-01-25 NOTE — Patient Outreach (Signed)
  Care Coordination   01/25/2023 Name: Matthew Wiggins MRN: [id number] DOB: Jul 04, 1985   Care Coordination Outreach Attempts:  An unsuccessful outreach was attempted for an appointment today.  Follow Up Plan:  Additional outreach attempts will be made to offer the patient complex care management information and services.   Encounter Outcome:  No Answer   Care Coordination Interventions:  No, not indicated    Delsa Sale RN BSN CCM [place]  [facility] Nurse Care Coordinator  Direct Dial: [phone] Website: [email or web address]

## 2023-01-28 ENCOUNTER — Telehealth: Payer: Self-pay | Admitting: Licensed Clinical Social Worker

## 2023-01-28 ENCOUNTER — Encounter: Payer: Self-pay | Admitting: Licensed Clinical Social Worker

## 2023-01-28 ENCOUNTER — Other Ambulatory Visit (HOSPITAL_BASED_OUTPATIENT_CLINIC_OR_DEPARTMENT_OTHER): Payer: Self-pay

## 2023-01-28 NOTE — Patient Outreach (Signed)
  Care Coordination   01/28/2023 Name: Matthew Wiggins MRN: [id number] DOB: May 29, 1985   Care Coordination Outreach Attempts:  An unsuccessful outreach was attempted for an appointment today.  Follow Up Plan:  Additional outreach attempts will be made to offer the patient complex care management information and services.   Encounter Outcome:  No Answer   Care Coordination Interventions:  No, not indicated    Jenel Lucks, LCSW [place]  [facility], [facility] Clinical Social Worker Direct Dial: [phone]  Fax: [phone] Website: [email or web address] 5:18 PM

## 2023-01-29 ENCOUNTER — Other Ambulatory Visit: Payer: Self-pay

## 2023-01-29 ENCOUNTER — Other Ambulatory Visit (HOSPITAL_BASED_OUTPATIENT_CLINIC_OR_DEPARTMENT_OTHER): Payer: Self-pay

## 2023-02-28 ENCOUNTER — Other Ambulatory Visit (INDEPENDENT_AMBULATORY_CARE_PROVIDER_SITE_OTHER): Payer: Commercial Managed Care - HMO

## 2023-02-28 DIAGNOSIS — Z794 Long term (current) use of insulin: Secondary | ICD-10-CM

## 2023-02-28 DIAGNOSIS — E1169 Type 2 diabetes mellitus with other specified complication: Secondary | ICD-10-CM

## 2023-02-28 NOTE — Progress Notes (Signed)
 02/28/2023 Name: Matthew Wiggins MRN: [id number] DOB: 1985/05/24  Chief Complaint  Patient presents with   Diabetes   Medication Management    Matthew Wiggins is a 38 y.o. year old male who presented for a telephone visit.   They were referred to the pharmacist by their PCP for assistance in managing diabetes.    Subjective:  Care Team: Primary Care Provider: Tollie Eth, NP Medication Access/Adherence  [facility]:  [facility] [address] [place] [address] Phone: [phone] Fax: [phone]   Patient reports affordability concerns with their medications: No  Patient reports access/transportation concerns to their pharmacy: No  Patient reports adherence concerns with their medications:  No     Diabetes:  Current medications: Levemir 20 units daily, Trulicity 1.5mg  Medications tried in the past: Metformin, Lantus, Basaglar Currently on: Levemir  Current glucose readings: fasting 180 today Checking fasting once daily, reports all readings above 130 and denies any lows   Patient denies hypoglycemic s/sx including dizziness, shakiness, sweating. Patient denies hyperglycemic symptoms including polyuria, polydipsia, polyphagia, nocturia, neuropathy, blurred vision.   Objective:  Lab Results  Component Value Date   HGBA1C 12.4 (H) 08/28/2022    Lab Results  Component Value Date   CREATININE 0.77 08/28/2022   BUN 9 08/28/2022   NA 135 08/28/2022   K 4.8 08/28/2022   CL 97 08/28/2022   CO2 24 08/28/2022    Lab Results  Component Value Date   CHOL 164 08/28/2022   HDL 36 (L) 08/28/2022   LDLCALC 112 (H) 08/28/2022   TRIG 82 08/28/2022   CHOLHDL 4.6 08/28/2022    Medications Reviewed Today     Reviewed by Sherrill Raring, RPH (Pharmacist) on 02/28/23 at 1418  Med List Status: <None>   Medication Order Taking? Sig Documenting Provider Last Dose Status Informant  Blood Glucose  Monitoring Suppl (ONE TOUCH ULTRA MINI) w/Device KIT [id number]  Use in the morning, at noon, and at bedtime. Tollie Eth, NP  Active   Dulaglutide (TRULICITY) 1.5 MG/0.5ML Ivory Broad [id number] Yes Inject 1.5 mg into the skin once a week. Month 2 Early, Sung Amabile, NP Taking Active   Dulaglutide (TRULICITY) 3 MG/0.5ML Ivory Broad [id number]  Inject 3 mg as directed once a week. Month 3. Early, Sung Amabile, NP  Active   insulin detemir (LEVEMIR) 100 UNIT/ML injection [id number]  Inject 0.1 mLs (10 Units total) into the skin daily. Tollie Eth, NP  Active            Med Note Leitha Bleak, Analia Zuk H   Thu Feb 28, 2023  2:18 PM) Taking 20 units daily  Insulin Pen Needle 32G X 4 MM MISC [id number]  Use daily Early, Sung Amabile, NP  Active   valsartan (DIOVAN) 40 MG tablet [id number] Yes Take 1 tablet (40 mg total) by mouth daily. For blood pressure. Tollie Eth, NP Taking Active   Vitamin D, Ergocalciferol, (DRISDOL) 1.25 MG (50000 UNIT) CAPS capsule [id number] Yes Take 1 capsule (50,000 Units total) by mouth every 7 (seven) days. Tollie Eth, NP Taking Active               Assessment/Plan:   Diabetes: - Currently uncontrolled - Reviewed long term cardiovascular and renal outcomes of uncontrolled blood sugar - Reviewed goal A1c, goal fasting, and goal 2 hour post prandial glucose - Reviewed dietary modifications including low carb diet - Recommend to continue current regimen as patient due to go up  to trulicity 3mg  in 2 weeks - Recommend to check glucose once daily    Follow Up Plan: 3 weeks    Sherrill Raring, PharmD Clinical Pharmacist [phone]

## 2023-03-04 ENCOUNTER — Telehealth: Payer: Self-pay | Admitting: *Deleted

## 2023-03-04 NOTE — Progress Notes (Signed)
 Complex Care Management Care Guide Note  03/04/2023 Name: Matthew Wiggins MRN: [id number] DOB: December 16, 1985  Matthew Wiggins is a 38 y.o. year old male who is a primary care patient of Early, Sung Amabile, NP and is actively engaged with the care management team. I reached out to [name] by phone today to assist with re-scheduling  with the RN Case Manager Licensed Visual merchandiser.  Follow up plan: Unsuccessful telephone outreach attempt made. A HIPAA compliant phone message was left for the patient providing contact information and requesting a return call.  Gwenevere Ghazi  [facility]  Value[facility]Based [facility] Institute, [facility] Guide  Direct Dial: [phone]  Fax [phone]

## 2023-03-11 NOTE — Progress Notes (Signed)
 Complex Care Management Care Guide Note  03/11/2023 Name: Matthew Wiggins MRN: [id number] DOB: 1985/10/15  Matthew Wiggins is a 38 y.o. year old male who is a primary care patient of Early, Sung Amabile, NP and is actively engaged with the care management team. I reached out to [name] by phone today to assist with re-scheduling  with the RN Case Manager Licensed Visual merchandiser.  Follow up plan: Unsuccessful telephone outreach attempt made. A HIPAA compliant phone message was left for the patient providing contact information and requesting a return call.No further outreach attempts will be made at this time. We have been unable to contact the patient to reschedule for complex care management services.   Gwenevere Ghazi  [facility]  Value-Based Care Institute, [facility] Guide  Direct Dial: [phone]  Fax [phone]

## 2023-03-21 ENCOUNTER — Other Ambulatory Visit: Payer: Commercial Managed Care - HMO

## 2023-03-21 DIAGNOSIS — E1159 Type 2 diabetes mellitus with other circulatory complications: Secondary | ICD-10-CM

## 2023-03-21 DIAGNOSIS — E1169 Type 2 diabetes mellitus with other specified complication: Secondary | ICD-10-CM

## 2023-03-21 DIAGNOSIS — Z794 Long term (current) use of insulin: Secondary | ICD-10-CM

## 2023-03-21 NOTE — Progress Notes (Signed)
 03/21/2023 Name: Matthew Wiggins MRN: [id number] DOB: 1985/05/05  Chief Complaint  Patient presents with   Diabetes   Hypertension   Medication Management    Matthew Wiggins is a 38 y.o. year old male who presented for a telephone visit.   They were referred to the pharmacist by their PCP for assistance in managing diabetes.    Subjective:  Care Team: Primary Care Provider: Tollie Eth, NP  Medication Access/Adherence  Current Pharmacy:  [facility] [address] [place] [address] Phone: [phone] Fax: [phone]   Patient reports affordability concerns with their medications: No  Patient reports access/transportation concerns to their pharmacy: No  Patient reports adherence concerns with their medications:  No     Diabetes:  Current medications: Levemir 20 units daily, Trulicity 1.5mg  Medications tried in the past: Metformin, Lantus, Basaglar  Current glucose readings: fasting 190 today Checking fasting once daily, reports all readings above 130 and denies any lows  Patient reports he gives Trulicity on Mondays and has one dose of 1.5mg  left for 03/25/23. Patient appears to be nonadherent based on this info and says he "may have missed a dose or two."  Patient denies hypoglycemic s/sx including dizziness, shakiness, sweating. Patient denies hyperglycemic symptoms including polyuria, polydipsia, polyphagia, nocturia, neuropathy, blurred vision.  Hypertension:  Current medications: Valsartan 40mg  daily Medications previously tried: Lisinopril/HCTZ  Patient does not have a validated, automated, upper arm home BP cufF  Patient denies hypotensive s/sx including dizziness, lightheadedness.  Patient denies hypertensive symptoms including headache, chest pain, shortness of breath    Objective:  Lab Results  Component Value Date   HGBA1C 12.4 (H) 08/28/2022    Lab Results  Component Value Date    CREATININE 0.77 08/28/2022   BUN 9 08/28/2022   NA 135 08/28/2022   K 4.8 08/28/2022   CL 97 08/28/2022   CO2 24 08/28/2022    Lab Results  Component Value Date   CHOL 164 08/28/2022   HDL 36 (L) 08/28/2022   LDLCALC 112 (H) 08/28/2022   TRIG 82 08/28/2022   CHOLHDL 4.6 08/28/2022    Medications Reviewed Today     Reviewed by Sherrill Raring, RPH (Pharmacist) on 03/21/23 at 1322  Med List Status: <None>   Medication Order Taking? Sig Documenting Provider Last Dose Status Informant  Blood Glucose Monitoring Suppl (ONE TOUCH ULTRA MINI) w/Device KIT [id number]  Use in the morning, at noon, and at bedtime. Tollie Eth, NP  Active   Dulaglutide (TRULICITY) 1.5 MG/0.5ML Ivory Broad [id number] Yes Inject 1.5 mg into the skin once a week. Month 2 Early, Sung Amabile, NP Taking Active   Dulaglutide (TRULICITY) 3 MG/0.5ML Ivory Broad [id number]  Inject 3 mg as directed once a week. Month 3. Early, Sung Amabile, NP  Active   insulin detemir (LEVEMIR) 100 UNIT/ML injection [id number] Yes Inject 0.1 mLs (10 Units total) into the skin daily. Tollie Eth, NP Taking Active            Med Note Leitha Bleak, Chester Sibert H   Thu Feb 28, 2023  2:18 PM) Taking 20 units daily  Insulin Pen Needle 32G X 4 MM MISC [id number]  Use daily Early, Sung Amabile, NP  Active   valsartan (DIOVAN) 40 MG tablet [id number] Yes Take 1 tablet (40 mg total) by mouth daily. For blood pressure. Tollie Eth, NP Taking Active   Vitamin D, Ergocalciferol, (DRISDOL) 1.25 MG (50000 UNIT) CAPS capsule [id number] Yes Take 1  capsule (50,000 Units total) by mouth every 7 (seven) days. Tollie Eth, NP Taking Active               Assessment/Plan:   Diabetes: - Currently uncontrolled - Reviewed long term cardiovascular and renal outcomes of uncontrolled blood sugar - Reviewed goal A1c, goal fasting, and goal 2 hour post prandial glucose - Reviewed dietary modifications including low carb diet - Recommend to start Trulicity 3mg  on 3/31 after giving last  injection of 1.5mg  on 03/25/23. Counseled on importance of adherence.  -INCREASE Levemir to 24 units daily -Contact office sooner than scheduled follow up if you begin to have sugars 70 or lower - Recommend to check glucose once daily at varying times  Hypertension: - Currently uncontrolled - Reviewed long term cardiovascular and renal outcomes of uncontrolled blood pressure - Reviewed appropriate blood pressure monitoring technique and reviewed goal blood pressure. Recommended to check home blood pressure and heart rate daily. Will work on obtaining free BP machine for patient by next f/u appt - Recommend to continue current medication therapy for now. Consider dose increase on valsartan if remains uncontrolled        Follow Up Plan: 2 weeks    Sherrill Raring, PharmD Clinical Pharmacist [phone]

## 2023-04-04 ENCOUNTER — Other Ambulatory Visit

## 2023-04-04 ENCOUNTER — Other Ambulatory Visit (HOSPITAL_BASED_OUTPATIENT_CLINIC_OR_DEPARTMENT_OTHER): Payer: Self-pay

## 2023-04-04 DIAGNOSIS — E1169 Type 2 diabetes mellitus with other specified complication: Secondary | ICD-10-CM

## 2023-04-04 DIAGNOSIS — Z794 Long term (current) use of insulin: Secondary | ICD-10-CM

## 2023-04-04 NOTE — Progress Notes (Signed)
 04/04/2023 Name: Matthew Wiggins MRN: [id number] DOB: 05/25/1985  Chief Complaint  Patient presents with   Diabetes   Medication Management   Hypertension    Matthew Wiggins is a 38 y.o. year old male who presented for a telephone visit.   They were referred to the pharmacist by their PCP for assistance in managing diabetes.    Subjective:  Care Team: Primary Care Provider: Tollie Eth, NP  Medication Access/Adherence  Current Pharmacy:  [facility] [address] [place] [address] Phone: [phone] Fax: [phone]   Patient reports affordability concerns with their medications: No  Patient reports access/transportation concerns to their pharmacy: No  Patient reports adherence concerns with their medications:  No     Diabetes:  Current medications: Levemir 24 units daily, Trulicity 1.5mg  Medications tried in the past: Metformin, Lantus, Basaglar  Current glucose readings: fasting 150 today Checking fasting once daily, reports all readings above 130 except for one BG reading of 70, reports he went an extended period of time without eating when this occurred.  Patient reports he gives Trulicity on Mondays and gave himself last dose of trulicity 1.5mg  on 04/01/23.  Patient appears to be nonadherent based on this info and says he "may have missed a dose or two."  Patient denies hypoglycemic s/sx including dizziness, shakiness, sweating. Patient denies hyperglycemic symptoms including polyuria, polydipsia, polyphagia, nocturia, neuropathy, blurred vision.  Reports he never picked up trulicity 3mg  due to an insurance issue. He is not sure on details.  Hypertension:  Current medications: Valsartan 40mg  daily Medications previously tried: Lisinopril/HCTZ  Patient does not have a validated, automated, upper arm home BP cuff  Patient denies hypotensive s/sx including dizziness, lightheadedness.  Patient denies  hypertensive symptoms including headache, chest pain, shortness of breath    Objective:  Lab Results  Component Value Date   HGBA1C 12.4 (H) 08/28/2022    Lab Results  Component Value Date   CREATININE 0.77 08/28/2022   BUN 9 08/28/2022   NA 135 08/28/2022   K 4.8 08/28/2022   CL 97 08/28/2022   CO2 24 08/28/2022    Lab Results  Component Value Date   CHOL 164 08/28/2022   HDL 36 (L) 08/28/2022   LDLCALC 112 (H) 08/28/2022   TRIG 82 08/28/2022   CHOLHDL 4.6 08/28/2022    Medications Reviewed Today     Reviewed by Sherrill Raring, RPH (Pharmacist) on 04/04/23 at 1513  Med List Status: <None>   Medication Order Taking? Sig Documenting Provider Last Dose Status Informant  Blood Glucose Monitoring Suppl (ONE TOUCH ULTRA MINI) w/Device KIT [id number]  Use in the morning, at noon, and at bedtime. Tollie Eth, NP  Active   Dulaglutide (TRULICITY) 1.5 MG/0.5ML Ivory Broad [id number]  Inject 1.5 mg into the skin once a week. Month 2 Early, Sung Amabile, NP  Active   Dulaglutide (TRULICITY) 3 MG/0.5ML Ivory Broad [id number]  Inject 3 mg as directed once a week. Month 3. Early, Sung Amabile, NP  Active   insulin detemir (LEVEMIR) 100 UNIT/ML injection [id number]  Inject 0.1 mLs (10 Units total) into the skin daily. Tollie Eth, NP  Active            Med Note Leitha Bleak, Ferne Ellingwood H   Thu Apr 04, 2023  3:13 PM) 24 units daily  Insulin Pen Needle 32G X 4 MM MISC [id number]  Use daily Early, Sung Amabile, NP  Active   valsartan (DIOVAN) 40 MG tablet  [id number] Yes Take 1 tablet (40 mg total) by mouth daily. For blood pressure. Tollie Eth, NP Taking Active   Vitamin D, Ergocalciferol, (DRISDOL) 1.25 MG (50000 UNIT) CAPS capsule [id number]  Take 1 capsule (50,000 Units total) by mouth every 7 (seven) days. Tollie Eth, NP  Active               Assessment/Plan:   Diabetes: - Currently uncontrolled - Reviewed long term cardiovascular and renal outcomes of uncontrolled blood sugar - Reviewed goal A1c, goal  fasting, and goal 2 hour post prandial glucose - Reviewed dietary modifications including low carb diet -Trulicity 3mg  needed a prior auth with pharmacy. Submitted via CoverMyMeds, Key: [id number], pending -Continue Levemir to 24 units daily -Contact office sooner than scheduled follow up if you begin to have sugars 70 or lower - Recommend to check glucose once daily at varying times  Hypertension: - Currently uncontrolled - Reviewed long term cardiovascular and renal outcomes of uncontrolled blood pressure - Reviewed appropriate blood pressure monitoring technique and reviewed goal blood pressure. Recommended to check home blood pressure and heart rate daily. Will work on obtaining free BP machine for patient by next f/u appt - Recommend to continue current medication therapy for now. Consider dose increase on valsartan if remains uncontrolled        Follow Up Plan: 2 weeks    Sherrill Raring, PharmD Clinical Pharmacist [phone]

## 2023-04-05 ENCOUNTER — Other Ambulatory Visit (HOSPITAL_BASED_OUTPATIENT_CLINIC_OR_DEPARTMENT_OTHER): Payer: Self-pay

## 2023-04-05 ENCOUNTER — Other Ambulatory Visit (HOSPITAL_COMMUNITY): Payer: Self-pay

## 2023-04-05 ENCOUNTER — Telehealth: Payer: Self-pay

## 2023-04-05 NOTE — Telephone Encounter (Addendum)
 Pharmacy Patient Advocate Encounter  Insurance verification completed.   The patient is insured through [employer] test claim for Trulicity 3mg /0.74ml. Currently a quantity of 2ml is a 28 day supply and the co-pay is RTS AND WAS FILLED TODAY ON 04/05/23 . With a copay of 25.00. However the rx hasn't been picked up at this time.     Status Approved, Key [id number]  This test claim was processed through [facility]- copay amounts may vary at other pharmacies due to pharmacy/plan contracts, or as the patient moves through the different stages of their insurance plan.

## 2023-04-16 ENCOUNTER — Other Ambulatory Visit (HOSPITAL_BASED_OUTPATIENT_CLINIC_OR_DEPARTMENT_OTHER): Payer: Self-pay

## 2023-04-18 ENCOUNTER — Other Ambulatory Visit (INDEPENDENT_AMBULATORY_CARE_PROVIDER_SITE_OTHER)

## 2023-04-18 ENCOUNTER — Other Ambulatory Visit (HOSPITAL_BASED_OUTPATIENT_CLINIC_OR_DEPARTMENT_OTHER): Payer: Self-pay

## 2023-04-18 DIAGNOSIS — Z794 Long term (current) use of insulin: Secondary | ICD-10-CM

## 2023-04-18 DIAGNOSIS — E1169 Type 2 diabetes mellitus with other specified complication: Secondary | ICD-10-CM

## 2023-04-18 NOTE — Progress Notes (Signed)
   04/18/2023 Name: Matthew Wiggins MRN: [id number] DOB: December 18, 1985  Chief Complaint  Patient presents with   Medication Management   Diabetes    Matthew Wiggins is a 38 y.o. year old male who presented for a telephone visit.   They were referred to the pharmacist by their PCP for assistance in managing diabetes.    Subjective:  Care Team: Primary Care Provider: Tollie Eth, NP  Medication Access/Adherence  Current Pharmacy:  [facility] [address] [place] [address] Phone: [phone] Fax: [phone]   Patient reports affordability concerns with their medications: No  Patient reports access/transportation concerns to their pharmacy: No  Patient reports adherence concerns with their medications:  No     Diabetes:  Current medications: Levemir 24 units daily, Trulicity 3mg  (not started yet) Medications tried in the past: Metformin, Lantus, Basaglar  Current glucose readings: elevated as he has been without the trulicity the last week Checking fasting once daily, reports all readings above 130 except for one BG reading of 70, reports he went an extended period of time without eating when this occurred.  Patient reports he gives Trulicity on Mondays and gave himself last dose of trulicity 1.5mg  on 04/01/23.  Patient appears to be nonadherent based on this info and says he "may have missed a dose or two."  Patient denies hypoglycemic s/sx including dizziness, shakiness, sweating. Patient denies hyperglycemic symptoms including polyuria, polydipsia, polyphagia, nocturia, neuropathy, blurred vision.   Hypertension:  Current medications: Valsartan 40mg  daily Medications previously tried: Lisinopril/HCTZ  Patient does not have a validated, automated, upper arm home BP cuff  Patient denies hypotensive s/sx including dizziness, lightheadedness.  Patient denies hypertensive symptoms including headache, chest  pain, shortness of breath    Objective:  Lab Results  Component Value Date   HGBA1C 12.4 (H) 08/28/2022    Lab Results  Component Value Date   CREATININE 0.77 08/28/2022   BUN 9 08/28/2022   NA 135 08/28/2022   K 4.8 08/28/2022   CL 97 08/28/2022   CO2 24 08/28/2022    Lab Results  Component Value Date   CHOL 164 08/28/2022   HDL 36 (L) 08/28/2022   LDLCALC 112 (H) 08/28/2022   TRIG 82 08/28/2022   CHOLHDL 4.6 08/28/2022    Medications Reviewed Today   Medications were not reviewed in this encounter       Assessment/Plan:   Diabetes: - Currently uncontrolled - Reviewed long term cardiovascular and renal outcomes of uncontrolled blood sugar - Reviewed goal A1c, goal fasting, and goal 2 hour post prandial glucose - Reviewed dietary modifications including low carb diet -Trulicity 3mg  refilled but patient never picked up. Requested pharmacy to fill again and patient will pick up and resume. -Continue Levemir to 24 units daily -Contact office sooner than scheduled follow up if you begin to have sugars 70 or lower - Recommend to check glucose once daily at varying times  Hypertension: - Currently uncontrolled - Reviewed long term cardiovascular and renal outcomes of uncontrolled blood pressure - Reviewed appropriate blood pressure monitoring technique and reviewed goal blood pressure. Recommended to check home blood pressure and heart rate daily. Will work on obtaining free BP machine for patient by next f/u appt - Recommend to continue current medication therapy for now. Consider dose increase on valsartan if remains uncontrolled    Follow Up Plan: 4 weeks    Sherrill Raring, PharmD Clinical Pharmacist [phone]

## 2023-04-25 ENCOUNTER — Other Ambulatory Visit (HOSPITAL_BASED_OUTPATIENT_CLINIC_OR_DEPARTMENT_OTHER): Payer: Self-pay

## 2023-04-25 ENCOUNTER — Other Ambulatory Visit: Payer: Self-pay

## 2023-05-06 ENCOUNTER — Other Ambulatory Visit (HOSPITAL_BASED_OUTPATIENT_CLINIC_OR_DEPARTMENT_OTHER): Payer: Self-pay

## 2023-05-16 ENCOUNTER — Other Ambulatory Visit

## 2023-05-16 NOTE — Progress Notes (Deleted)
   05/16/2023  Patient ID: Matthew Wiggins, male   DOB: 22-Feb-1985, 38 y.o.   MRN: [id number]  Attempted to contact patient for scheduled appointment for medication management. Left HIPAA compliant message for patient to return my call at their convenience.   Carnell Christian, PharmD Clinical Pharmacist [phone]

## 2023-06-19 ENCOUNTER — Other Ambulatory Visit (HOSPITAL_BASED_OUTPATIENT_CLINIC_OR_DEPARTMENT_OTHER): Payer: Self-pay

## 2023-06-20 ENCOUNTER — Other Ambulatory Visit (HOSPITAL_BASED_OUTPATIENT_CLINIC_OR_DEPARTMENT_OTHER): Payer: Self-pay

## 2023-06-25 ENCOUNTER — Other Ambulatory Visit (HOSPITAL_BASED_OUTPATIENT_CLINIC_OR_DEPARTMENT_OTHER): Payer: Self-pay

## 2023-06-26 ENCOUNTER — Other Ambulatory Visit (HOSPITAL_BASED_OUTPATIENT_CLINIC_OR_DEPARTMENT_OTHER): Payer: Self-pay

## 2023-06-28 ENCOUNTER — Other Ambulatory Visit (HOSPITAL_BASED_OUTPATIENT_CLINIC_OR_DEPARTMENT_OTHER): Payer: Self-pay

## 2023-07-02 ENCOUNTER — Other Ambulatory Visit (HOSPITAL_BASED_OUTPATIENT_CLINIC_OR_DEPARTMENT_OTHER): Payer: Self-pay

## 2023-07-02 ENCOUNTER — Other Ambulatory Visit: Payer: Self-pay

## 2023-07-03 ENCOUNTER — Other Ambulatory Visit: Payer: Self-pay | Admitting: Nurse Practitioner

## 2023-07-03 DIAGNOSIS — R809 Proteinuria, unspecified: Secondary | ICD-10-CM

## 2023-07-03 DIAGNOSIS — E1159 Type 2 diabetes mellitus with other circulatory complications: Secondary | ICD-10-CM

## 2023-07-03 DIAGNOSIS — E1169 Type 2 diabetes mellitus with other specified complication: Secondary | ICD-10-CM

## 2023-07-03 DIAGNOSIS — E559 Vitamin D deficiency, unspecified: Secondary | ICD-10-CM

## 2023-07-03 DIAGNOSIS — G4733 Obstructive sleep apnea (adult) (pediatric): Secondary | ICD-10-CM

## 2023-07-03 NOTE — Telephone Encounter (Unsigned)
 Copied from CRM [id number]. Topic: Clinical - Medication Refill >> Jul 03, 2023  2:57 PM Charlet HERO wrote: Medication: Dulaglutide  (TRULICITY ) 3 MG/0.5ML SOAJ , Vitamin D , Ergocalciferol , (DRISDOL ) 1.25 MG (50000 UNIT) CAPS capsule  Has the patient contacted their pharmacy? Yes That the script was not approved by pcp  This is the patient's preferred pharmacy:  [facility]  [address] [place]  [place], [place] [address] [phone]  Is this the correct pharmacy for this prescription? Yes If no, delete pharmacy and type the correct one.   Has the prescription been filled recently? Yes  Is the patient out of the medication? Yes  Has the patient been seen for an appointment in the last year OR does the patient have an upcoming appointment? Yes  Can we respond through MyChart? No  Agent: Please be advised that Rx refills may take up to 3 business days. We ask that you follow-up with your pharmacy.

## 2023-07-04 MED ORDER — TRULICITY 3 MG/0.5ML ~~LOC~~ SOAJ: 3.0000 mg | SUBCUTANEOUS | 0 refills | Status: AC

## 2023-07-04 MED ORDER — VITAMIN D (ERGOCALCIFEROL) 1.25 MG (50000 UNIT) PO CAPS: 50000.0000 [IU] | ORAL_CAPSULE | ORAL | 3 refills | Status: AC

## 2023-07-30 ENCOUNTER — Telehealth: Payer: Self-pay | Admitting: Physician Assistant

## 2023-07-30 ENCOUNTER — Ambulatory Visit: Payer: Self-pay | Admitting: Nurse Practitioner

## 2023-07-30 ENCOUNTER — Other Ambulatory Visit (HOSPITAL_BASED_OUTPATIENT_CLINIC_OR_DEPARTMENT_OTHER): Payer: Self-pay

## 2023-07-30 DIAGNOSIS — R197 Diarrhea, unspecified: Secondary | ICD-10-CM

## 2023-07-30 MED ORDER — LOPERAMIDE HCL 2 MG PO TABS
2.0000 mg | ORAL_TABLET | Freq: Four times a day (QID) | ORAL | 0 refills | Status: AC | PRN
Start: 2023-07-30 — End: ?
  Filled 2023-07-30: qty 24, 6d supply, fill #0

## 2023-07-30 NOTE — Progress Notes (Signed)
 Virtual Visit Consent   [name], you are scheduled for a virtual visit with a [facility] provider today. Just as with appointments in the office, your consent must be obtained to participate. Your consent will be active for this visit and any virtual visit you may have with one of our providers in the next 365 days. If you have a MyChart account, a copy of this consent can be sent to you electronically.  As this is a virtual visit, video technology does not allow for your provider to perform a traditional examination. This may limit your provider's ability to fully assess your condition. If your provider identifies any concerns that need to be evaluated in person or the need to arrange testing (such as labs, EKG, etc.), we will make arrangements to do so. Although advances in technology are sophisticated, we cannot ensure that it will always work on either your end or our end. If the connection with a video visit is poor, the visit may have to be switched to a telephone visit. With either a video or telephone visit, we are not always able to ensure that we have a secure connection.  By engaging in this virtual visit, you consent to the provision of healthcare and authorize for your insurance to be billed (if applicable) for the services provided during this visit. Depending on your insurance coverage, you may receive a charge related to this service.  I need to obtain your verbal consent now. Are you willing to proceed with your visit today? [name] has provided verbal consent on 07/30/2023 for a virtual visit (video or telephone). [name], PA-C  Date: 07/30/2023 6:19 PM   Virtual Visit via Video Note   I, [name], connected with  Matthew Wiggins  ([id number], [birth year]) on 07/30/23 at  6:00 PM EDT by a video-enabled telemedicine application and verified that I am speaking with the correct person using two identifiers.  Location: Patient: Virtual Visit Location  Patient: Home Provider: Virtual Visit Location Provider: Home Office   I discussed the limitations of evaluation and management by telemedicine and the availability of in person appointments. The patient expressed understanding and agreed to proceed.    History of Present Illness: Matthew Wiggins is a 38 y.o. who identifies as a male who was assigned male at birth, and is being seen today for GI upset.  HPI: 37y/o M presents for a telehealth video visit for c/o  diarrhea x 2 days. Noticed onset of diarrhea after Trulicity  dose increase. Denies recent international travel. No diet changes. Hasn't tried new restaurants. Denies fever or urinary symptoms. No nausea or vomiting.     Problems:  Patient Active Problem List   Diagnosis Date Noted   Encounter for annual physical exam 08/31/2022   Candidal intertrigo 08/28/2022   Moderately increased albuminuria 05/02/2020   Type 2 diabetes mellitus with other specified complication (HCC) 04/21/2020   OSA (obstructive sleep apnea) 12/06/2017   Microcytic anemia 10/04/2017   Tachycardia 10/04/2017   Morbid obesity (HCC) 08/22/2017   Hypertension associated with diabetes (HCC) 08/22/2017   Post-traumatic osteoarthritis of right knee 08/22/2017    Allergies:  Allergies  Allergen Reactions   Amoxicillin    Medications:  Current Outpatient Medications:    loperamide  (IMODIUM  A-D) 2 MG tablet, Take 1 tablet (2 mg total) by mouth 4 (four) times daily as needed for diarrhea or loose stools., Disp: 30 tablet, Rfl: 0   Blood Glucose Monitoring Suppl (ONE TOUCH ULTRA  MINI) w/Device KIT, Use in the morning, at noon, and at bedtime., Disp: 1 kit, Rfl: 0   Dulaglutide  (TRULICITY ) 3 MG/0.5ML SOAJ, Inject 3 mg as directed once a week. Month 3., Disp: 2 mL, Rfl: 0   insulin  detemir (LEVEMIR ) 100 UNIT/ML injection, Inject 0.1 mLs (10 Units total) into the skin daily., Disp: 15 mL, Rfl: 0   Insulin  Pen Needle 32G X 4 MM MISC, Use daily, Disp: 100 each, Rfl:  1   valsartan  (DIOVAN ) 40 MG tablet, Take 1 tablet (40 mg total) by mouth daily. For blood pressure., Disp: 90 tablet, Rfl: 3   Vitamin D , Ergocalciferol , (DRISDOL ) 1.25 MG (50000 UNIT) CAPS capsule, Take 1 capsule (50,000 Units total) by mouth every 7 (seven) days., Disp: 12 capsule, Rfl: 3  Observations/Objective: Patient is well-developed, well-nourished in no acute distress.  Resting comfortably  at home.  Head is normocephalic, atraumatic.  No labored breathing.  Speech is clear and coherent with logical content.  Patient is alert and oriented at baseline.    Assessment and Plan: 1. Diarrhea, unspecified type (Primary) - loperamide  (IMODIUM  A-D) 2 MG tablet; Take 1 tablet (2 mg total) by mouth 4 (four) times daily as needed for diarrhea or loose stools.  Dispense: 30 tablet; Refill: 0  Stay well hydrated.  May drink Gatorade or other sports drinks to help replenish electrolytes. Take Imodium  and take as directed. Follow up with prescribing provider regarding Trulicity  dose adjustment if necessary.  Continue to watch for worsening symptoms. Schedule a virtual appointment or follow up at an urgent care clinic if symptoms don't improve.  Pt verbalized understanding and in agreement.    Follow Up Instructions: I discussed the assessment and treatment plan with the patient. The patient was provided an opportunity to ask questions and all were answered. The patient agreed with the plan and demonstrated an understanding of the instructions.  A copy of instructions were sent to the patient via MyChart unless otherwise noted below.   Patient has requested to receive PHI (AVS, Work Notes, etc) pertaining to this video visit through e-mail as they are currently without active MyChart. They have voiced understand that email is not considered secure and their health information could be viewed by someone other than the patient.   The patient was advised to call back or seek an in-person  evaluation if the symptoms worsen or if the condition fails to improve as anticipated.    [name], PA-C

## 2023-07-30 NOTE — Patient Instructions (Signed)
  Andrzej D Voshell, thank you for joining [name], PA-C for today's virtual visit.  While this provider is not your primary care provider (PCP), if your PCP is located in our provider database this encounter information will be shared with them immediately following your visit.   A [facility] MyChart account gives you access to today's visit and all your visits, tests, and labs performed at [facility]  click here if you don't have a [facility] MyChart account or go to [email or web address]  Consent: (Patient) Matthew Wiggins provided verbal consent for this virtual visit at the beginning of the encounter.  Current Medications:  Current Outpatient Medications:    loperamide  (IMODIUM  A-D) 2 MG tablet, Take 1 tablet (2 mg total) by mouth 4 (four) times daily as needed for diarrhea or loose stools., Disp: 30 tablet, Rfl: 0   Blood Glucose Monitoring Suppl (ONE TOUCH ULTRA MINI) w/Device KIT, Use in the morning, at noon, and at bedtime., Disp: 1 kit, Rfl: 0   Dulaglutide  (TRULICITY ) 3 MG/0.5ML SOAJ, Inject 3 mg as directed once a week. Month 3., Disp: 2 mL, Rfl: 0   insulin  detemir (LEVEMIR ) 100 UNIT/ML injection, Inject 0.1 mLs (10 Units total) into the skin daily., Disp: 15 mL, Rfl: 0   Insulin  Pen Needle 32G X 4 MM MISC, Use daily, Disp: 100 each, Rfl: 1   valsartan  (DIOVAN ) 40 MG tablet, Take 1 tablet (40 mg total) by mouth daily. For blood pressure., Disp: 90 tablet, Rfl: 3   Vitamin D , Ergocalciferol , (DRISDOL ) 1.25 MG (50000 UNIT) CAPS capsule, Take 1 capsule (50,000 Units total) by mouth every 7 (seven) days., Disp: 12 capsule, Rfl: 3   Medications ordered in this encounter:  Meds ordered this encounter  Medications   loperamide  (IMODIUM  A-D) 2 MG tablet    Sig: Take 1 tablet (2 mg total) by mouth 4 (four) times daily as needed for diarrhea or loose stools.    Dispense:  30 tablet    Refill:  0    Supervising Provider:   LAMPTEY, PHILIP O [[id number]]     *If  you need refills on other medications prior to your next appointment, please contact your pharmacy*  Follow-Up: Call back or seek an in-person evaluation if the symptoms worsen or if the condition fails to improve as anticipated.  [facility] [phone]  Other Instructions Stay well hydrated.  May drink Gatorade or other sports drinks to help replenish electrolytes. Take Imodium  and take as directed. Follow up with prescribing provider regarding Trulicity  dose adjustment if necessary.  Continue to watch for worsening symptoms. Schedule a virtual appointment or follow up at an urgent care clinic if symptoms don't improve.    If you have been instructed to have an in-person evaluation today at a local Urgent Care facility, please use the link below. It will take you to a list of all of our available [facility], including address, phone number and hours of operation. Please do not delay care.  [facility]  If you or a family member do not have a primary care provider, use the link below to schedule a visit and establish care. When you choose a [facility] primary care physician or advanced practice provider, you gain a long-term partner in health. Find a Primary Care Provider  Learn more about [facility]'s in-office and virtual care options:  - Get Care Now

## 2023-07-30 NOTE — Telephone Encounter (Signed)
 FYI Only or Action Required?: FYI only for provider.  Patient was last seen in primary care on 10/11/2022 by Early, Camie BRAVO, NP.  Called Nurse Triage reporting Medication Reaction.  Symptoms began several days ago.   Symptoms are: unchanged.  Triage Disposition: See HCP Within 4 Hours (Or PCP Triage)  Patient/caregiver understands and will follow disposition?: Yes                  Copied from CRM [id number]. Topic: Clinical - Medical Advice >> Jul 30, 2023  4:13 PM Myrick T wrote: Reason for CRM: mom called stated she thinks they got a bad batch of Trulicity  as they are sick with stomach pains and diarrhea. Mom says she may need a telehealth visit for patient Reason for Disposition  [1] Constant abdominal pain AND [2] present > 2 hours  Answer Assessment - Initial Assessment Questions Patient scheduled for a virtual urgent care visit today.  This RN spoke with pt's mom and patient. Pt mom also having these symptoms (pt mom went to ED and was given IV fluids/nausea meds). Pt mom thinks it is from Trulicity .   Diarrhea started Sunday; diarrhea happening every hour Denies vomiting, blood in stool, fever Stomach pain intermittent; 5/10 pain level Drinking pedialyte, Gatorade  Protocols used: Diarrhea-A-AH

## 2023-08-10 ENCOUNTER — Other Ambulatory Visit (HOSPITAL_BASED_OUTPATIENT_CLINIC_OR_DEPARTMENT_OTHER): Payer: Self-pay

## 2023-12-12 ENCOUNTER — Other Ambulatory Visit: Payer: Self-pay | Admitting: Nurse Practitioner

## 2023-12-12 DIAGNOSIS — I152 Hypertension secondary to endocrine disorders: Secondary | ICD-10-CM

## 2023-12-12 MED ORDER — VALSARTAN 40 MG PO TABS: 40.0000 mg | ORAL_TABLET | Freq: Every day | ORAL | 0 refills | Status: AC

## 2023-12-12 NOTE — Telephone Encounter (Signed)
 Copied from CRM #[id number]. Topic: Clinical - Medication Refill >> Dec 12, 2023  9:22 AM Myrick T wrote: Medication: valsartan  (DIOVAN ) 40 MG tablet  Has the patient contacted their pharmacy? No  This is the patient's preferred pharmacy:  [id number] - [place] [place] - [address] [place] [address] Phone: [phone] Fax: [phone]  Is this the correct pharmacy for this prescription? Yes   Has the prescription been filled recently? Yes  Is the patient out of the medication? No  Has the patient been seen for an appointment in the last year OR does the patient have an upcoming appointment? No  Can we respond through MyChart? Yes  Agent: Please be advised that Rx refills may take up to 3 business days. We ask that you follow-up with your pharmacy.

## 2024-01-29 NOTE — Discharge Summary (Addendum)
 " Discharge Summary [facility] Medicine   Admit Date:  01/27/2024 Discharge Date:  01/29/24 Total LOS:  2  Admitting Attending: Saralee Dennise Kays, MD Discharge Attending: Greig Auburn Rigg, MD   Demographics   PCP: None, Unassigned None   Code Status: Full Code  Readmit Risk: LACE+ Score: 39 Diet: Diet Adult; Diabetes (Carbohydrate) Calorie Controlled; 1800 Calorie (60g CHO); Patient can order with Assistance     Follow Up   No future appointments. Requested Follow Up     None, Unassigned      Timing: Follow up   [facility]      Timing: Follow up   [facility]      Timing: Schedule an appointment as soon as possible for a visit in 3 day(s)   [facility]      Timing: Schedule an appointment as soon as possible for a visit in 1 week(s)   Follow Up Needs: morbid obesity, uncontrolled DM, HTN      Chronic superficial wounds of chest wall/axilla with contributing uncontrolled type II DM/hyperglycemia as etiology for non healing  Recommendations for follow-up providers:  Wound care referral PCP referral  Pending studies:  none Discharge Diagnoses   Principal Problem:   Cellulitis of chest wall (POA: Not Specified) Active Problems:   Cellulitis of abdominal wall (POA: Not Specified)   Skin erosion (POA: Not Specified)   Uncontrolled type 2 diabetes mellitus with hyperglycemia, without long-term current use of insulin  (HCC) (POA: Not Specified)   Hypertensive urgency (POA: Not Specified)   Morbid obesity due to excess calories (HCC) (POA: Not Specified) Resolved Problems:   * No resolved hospital problems. *   HPI and Hospital Course   Chief Complaint  Patient presents with   Rash    All over x 1 month        See hospital course under discharge for details of today - d/c with tub of silvadene, I reviewed with mother on how to apply  bid  Medicaid application started     Cellulitis of chest wall Present on Admission: Unknown   Appearance more of skin breakdown in areas of moisture, however, some erythema is noted - erythema is resolved at d/c, there is no acute cellulitis, okay to finish the keflex that patient has remaining at home as do not suspect MRSA and wounds are superficial Agree with wound care, silvadene for wounds - tub sent home at d/c with phone instructions to mother on aplication bid Blood cx negative D/c to wound care clinic iv vancomcycin and cefepime x 48 hours - now d/c as cultures negative and no acute infection, sepsis r/o   Cellulitis of abdominal wall Present on Admission: Unknown See above, mostly wound care will be treatment Denies use of heating pads or burns - however, much improved with silvadene, mother saw superficial skin breakdown yesterday agrees much improved   Skin erosion Present on Admission: Unknown This is most likely dx Wound are at d/c   Uncontrolled type 2 diabetes mellitus with hyperglycemia, without long-term current use of insulin  (HCC) Present on Admission: Unknown Continue glipizide and meformin Recommend GLP 1, did well on this in past, medicaid pending, qualifies based on uncontrolled DM, morbid obesity   Hypertensive urgency Present on Admission: Unknown   Resolved, stable BP on diovan  and norvasc, meds can be titrated as outpatient at this time  Morbid obesity   Barrier to wound healing, encourage weight loss  Additional Health Factors:  Obesity, Class III - BMI (Calculated): 67.09    Consults: wound care  Procedures: None Discharge Exam and Pertinent Labs   Vitals:  Most Recent: Range (last 24 hours)  Temperature: 97.4 F (36.3 C)  [97.4 F (36.3 C)-98.1 F (36.7 C)]   Heart Rate: (!) 110  [104-110]   Resp Rate: 18  [13-20]   Blood Pressure: (!) 160/86 (124-160)/(82-86)   Oxygen Sats: 95 %  [95 %-99 %]   Oxygen Device: Room Air    Weight: (!) 224 kg (494 lb 4.3 oz)    Discharge Condition: stable  Physical Exam:  Vitals: As noted above General appearance: No overt signs of distress, morbidly obese Respiratory: Stable work of breathing CV: RRR GI: Soft, non-tender MSK: No peripheral edema Skin: areas of abrasions, skin breakdown R chest wall/axilla in areas of moisture, no herpetic lesions, not much surrounding celluliitis  Psych: Alert and answering questions appropriately   Recent [facility] Labs: Selected Recent Results  Lab Units 01/28/24 0323  WBC K/uL 7.4  HEMOGLOBIN g/dL 9.9*  HEMATOCRIT % 68.8*  PLATELETS K/uL 411*   Selected Recent Results  Lab Units 01/29/24 0551  SODIUM mmol/L 138  POTASSIUM mmol/L 3.7  CHLORIDE mmol/L 103  CO2 mmol/L 24  BUN mg/dL 5*  CREATININE mg/dL 9.39*  GLUCOSE mg/dL 874*  CALCIUM mg/dL 8.5*      Additional [facility] Test Results:   Micro:  Blood cultures negative  Imaging:  CT 01/28/24  IMPRESSION: No definite abnormality in the chest, abdomen, or pelvis. Chest and abdominal wall are not completely evaluated due to patient body habitus and gantry size. Single prominent right axillary lymph node, presumably reactive      Discharge Medication List     New Medications    amLODIPine 10 mg tablet Take 1 tablet (10 mg) by mouth daily Qty: 30 tablet Refills: 0 Last Dose: 10 mg on January 29, 2024  8:18 AM Commonly known as: NORVASC Start taking on: January 30, 2024   glipiZIDE 5 mg 24 hr tablet Take 1 tablet (5 mg) by mouth daily with breakfast Qty: 90 tablet Refills: 3 Last Dose: 5 mg on January 29, 2024  8:17 AM Commonly known as: GLUCOTROL XL Start taking on: January 30, 2024   metFORMIN  500 mg tablet Take 1 tablet (500 mg) by mouth 2 (two) times a day Qty: 180 tablet Refills: 3 Last Dose: 500 mg on January 29, 2024  8:17 AM Commonly known as: GLUCOPHAGE    miconazole 2 % cream Apply topically 2 (two) times a day Qty: 57 g Refills: 1 Last  Dose: January 29, 2024  8:19 AM Commonly known as: MICOTIN   silver sulfadiazine 1 % cream Apply topically 2 (two) times a day Qty: 50 g Refills: 1 Last Dose: January 29, 2024  8:19 AM Commonly known as: SILVADENE   valsartan  40 mg tablet Take 1 tablet (40 mg) by mouth daily Qty: 90 tablet Refills: 3 Last Dose: 40 mg on January 29, 2024  8:17 AM Commonly known as: DIOVAN        Continued Medications    cephalexin 500 mg capsule Take 1 capsule (500 mg) by mouth in the morning and 1 capsule (500 mg) at noon and 1 capsule (500 mg) in the evening and 1 capsule (500 mg) before bedtime. Do all this for 10 days. Qty: 40 capsule Refills: 0       Stopped Medications    nystatin powder Commonly known as: MYCOSTATIN  Administrative  Discharge Disposition: Home   Time: On the day of discharge, I spent 40 minutes reviewing pertinent records, performing/confirming relevant history and exam, discussing and coordinating care, and documenting the encounter.  Amy Auburn Rigg, MD Hospitalist "
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
